# Patient Record
Sex: Female | Born: 1975 | Race: White | Hispanic: No | Marital: Single | Smoking: Never smoker
Health system: Southern US, Community
[De-identification: ages and names within clinical notes are randomized; demographics above are authoritative.]

## PROBLEM LIST (undated history)

## (undated) DIAGNOSIS — G43909 Migraine, unspecified, not intractable, without status migrainosus: Secondary | ICD-10-CM

## (undated) HISTORY — DX: Migraine, unspecified, not intractable, without status migrainosus: G43.909

## (undated) HISTORY — PX: COSMETIC SURGERY: SHX468

---

## 1999-07-16 ENCOUNTER — Inpatient Hospital Stay (HOSPITAL_COMMUNITY): Admission: AD | Admit: 1999-07-16 | Discharge: 1999-07-16 | Payer: Self-pay | Admitting: *Deleted

## 2002-05-02 ENCOUNTER — Other Ambulatory Visit: Admission: RE | Admit: 2002-05-02 | Discharge: 2002-05-02 | Payer: Self-pay | Admitting: *Deleted

## 2003-07-17 ENCOUNTER — Other Ambulatory Visit: Admission: RE | Admit: 2003-07-17 | Discharge: 2003-07-17 | Payer: Self-pay | Admitting: Obstetrics and Gynecology

## 2004-12-02 ENCOUNTER — Other Ambulatory Visit: Admission: RE | Admit: 2004-12-02 | Discharge: 2004-12-02 | Payer: Self-pay | Admitting: Obstetrics and Gynecology

## 2006-01-16 ENCOUNTER — Other Ambulatory Visit: Admission: RE | Admit: 2006-01-16 | Discharge: 2006-01-16 | Payer: Self-pay | Admitting: Obstetrics & Gynecology

## 2007-04-18 ENCOUNTER — Other Ambulatory Visit: Admission: RE | Admit: 2007-04-18 | Discharge: 2007-04-18 | Payer: Self-pay | Admitting: Obstetrics and Gynecology

## 2007-12-20 ENCOUNTER — Ambulatory Visit (HOSPITAL_COMMUNITY): Admission: RE | Admit: 2007-12-20 | Discharge: 2007-12-20 | Payer: Self-pay | Admitting: Obstetrics and Gynecology

## 2009-06-19 ENCOUNTER — Ambulatory Visit (HOSPITAL_COMMUNITY): Admission: RE | Admit: 2009-06-19 | Discharge: 2009-06-19 | Payer: Self-pay | Admitting: Obstetrics and Gynecology

## 2009-07-07 ENCOUNTER — Ambulatory Visit (HOSPITAL_COMMUNITY): Admission: RE | Admit: 2009-07-07 | Discharge: 2009-07-07 | Payer: Self-pay | Admitting: Obstetrics and Gynecology

## 2009-07-17 ENCOUNTER — Ambulatory Visit (HOSPITAL_COMMUNITY): Admission: RE | Admit: 2009-07-17 | Discharge: 2009-07-17 | Payer: Self-pay | Admitting: Obstetrics and Gynecology

## 2009-08-14 ENCOUNTER — Ambulatory Visit (HOSPITAL_COMMUNITY): Admission: RE | Admit: 2009-08-14 | Discharge: 2009-08-14 | Payer: Self-pay | Admitting: Internal Medicine

## 2009-09-25 ENCOUNTER — Ambulatory Visit (HOSPITAL_COMMUNITY): Admission: RE | Admit: 2009-09-25 | Discharge: 2009-09-25 | Payer: Self-pay | Admitting: Obstetrics and Gynecology

## 2009-10-05 ENCOUNTER — Inpatient Hospital Stay (HOSPITAL_COMMUNITY): Admission: AD | Admit: 2009-10-05 | Discharge: 2009-10-05 | Payer: Self-pay | Admitting: Obstetrics and Gynecology

## 2009-10-06 ENCOUNTER — Ambulatory Visit: Admission: RE | Admit: 2009-10-06 | Discharge: 2009-10-06 | Payer: Self-pay | Admitting: Obstetrics and Gynecology

## 2009-10-06 ENCOUNTER — Encounter (INDEPENDENT_AMBULATORY_CARE_PROVIDER_SITE_OTHER): Payer: Self-pay | Admitting: Obstetrics and Gynecology

## 2009-12-16 IMAGING — US US OB DETAIL+14 WK
1 series · 18 of 28 positions shown · non-contrast
Comparison: none

OBSTETRICAL ULTRASOUND:
 This ultrasound was performed in The [HOSPITAL], and the [facility] report will be stored to [REDACTED] PACS.

[Series 1: us ob detail+14 wk · 102 acquisitions, 18 frames shown]
[im 1/102]
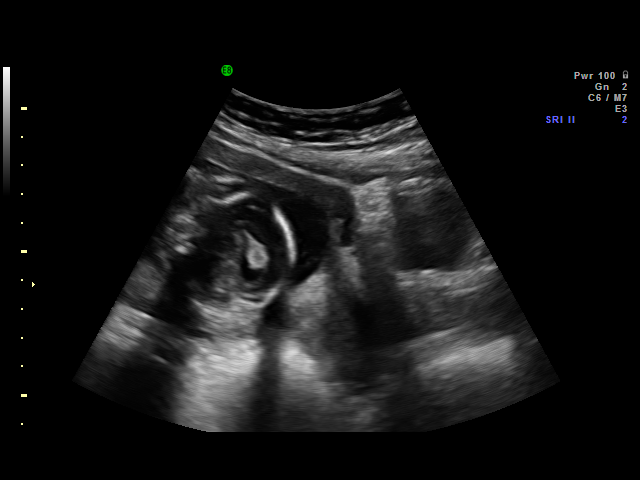
[im 8/102]
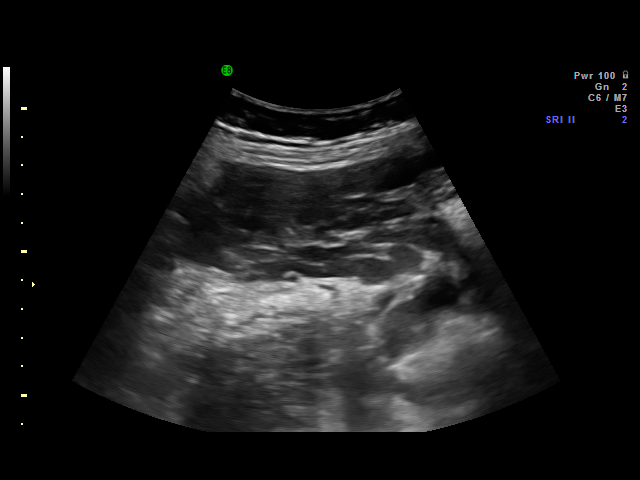
[im 12/102]
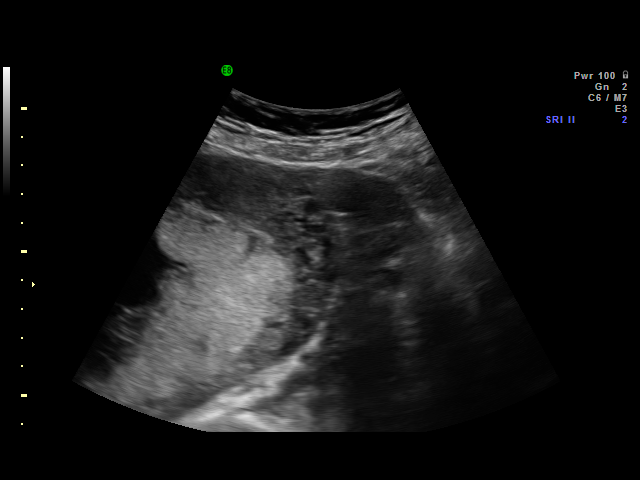
[im 19/102]
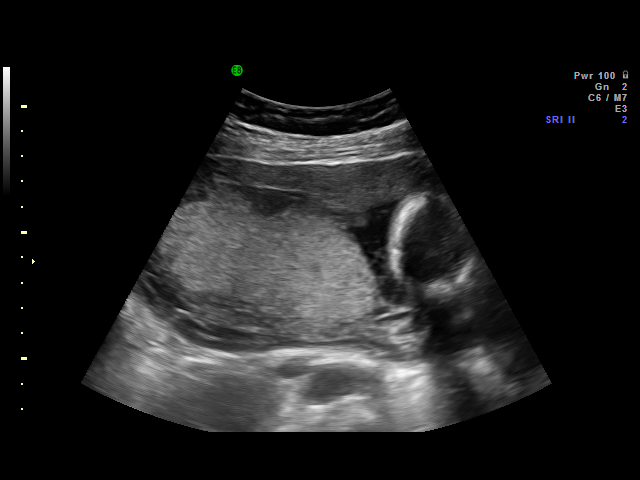
[im 27/102]
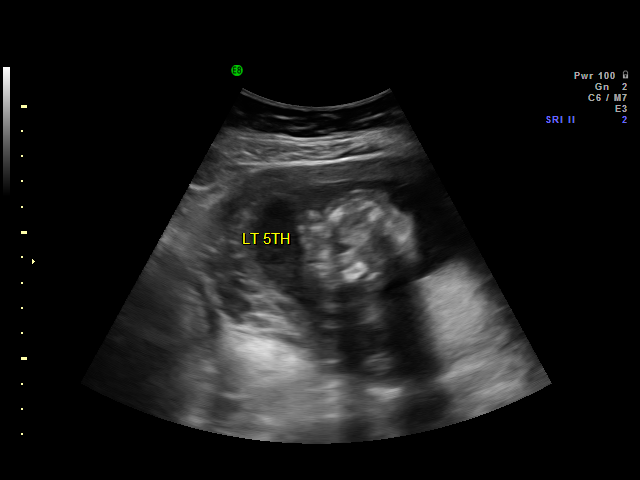
[im 30/102]
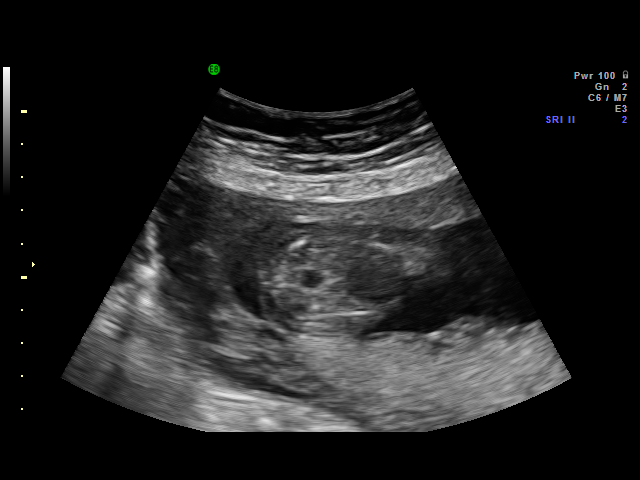
[im 38/102]
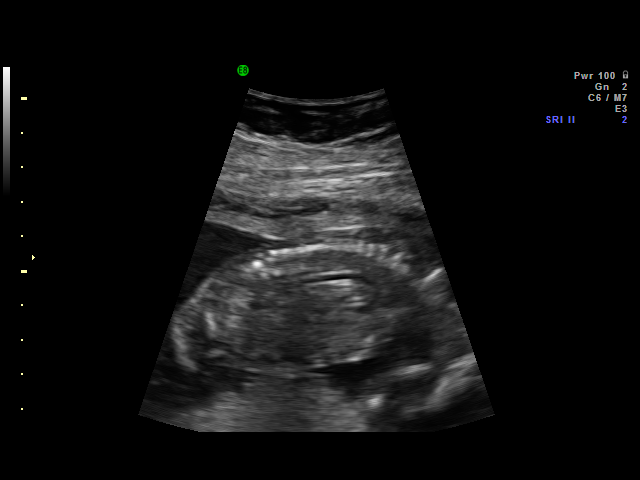
[im 42/102]
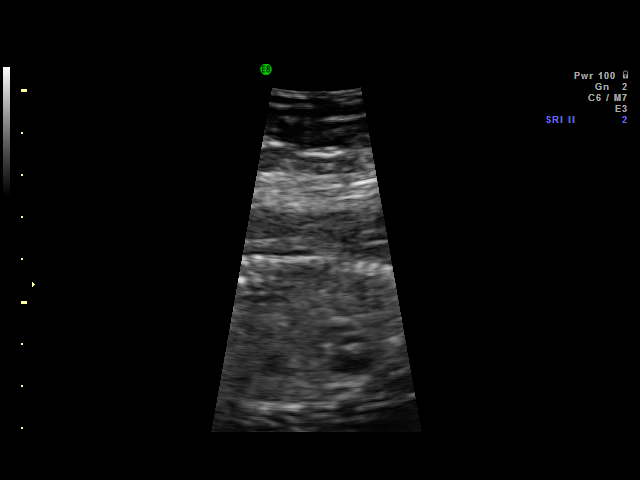
[im 49/102]
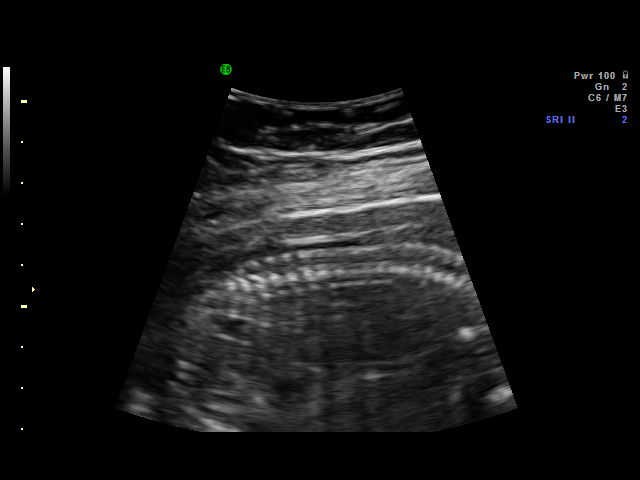
[im 53/102]
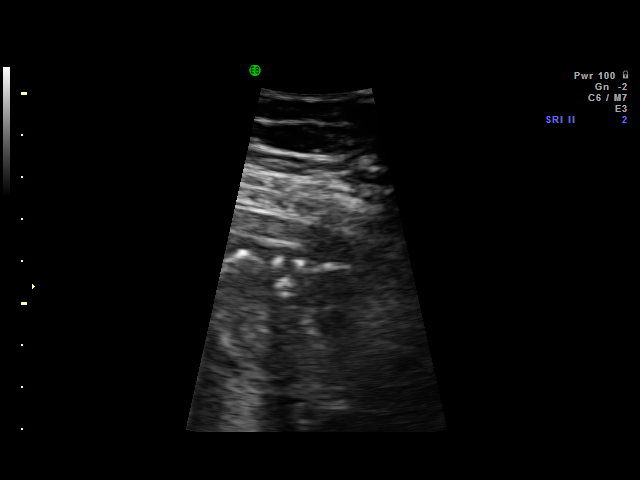
[im 60/102]
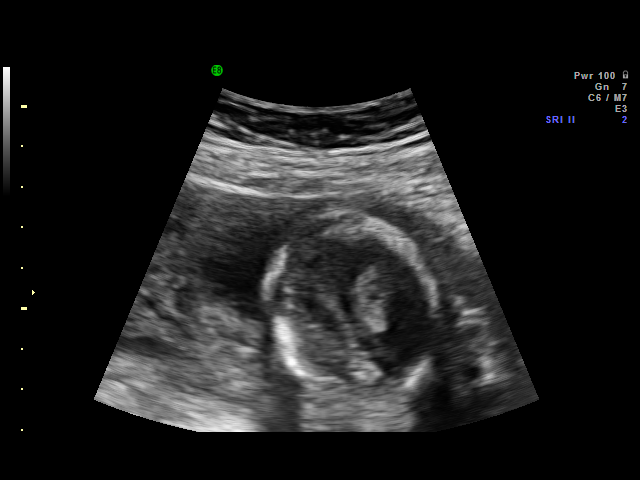
[im 64/102]
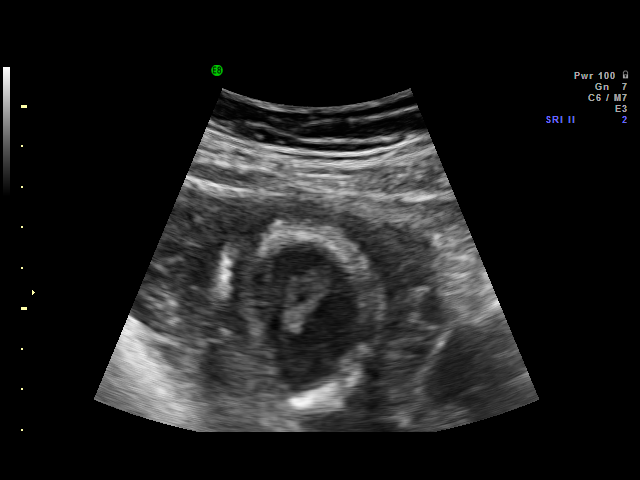
[im 72/102]
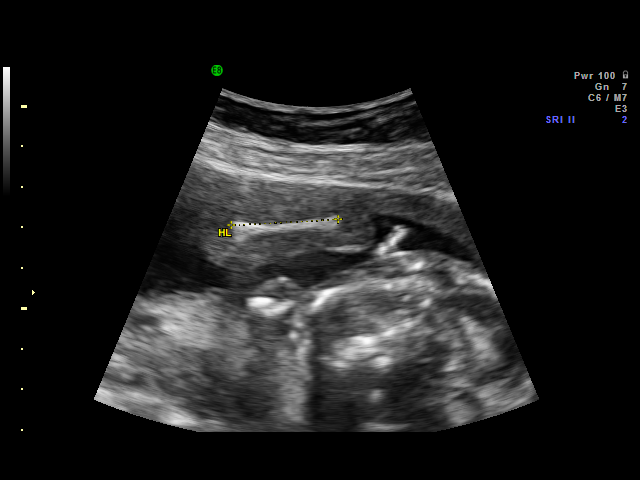
[im 79/102]
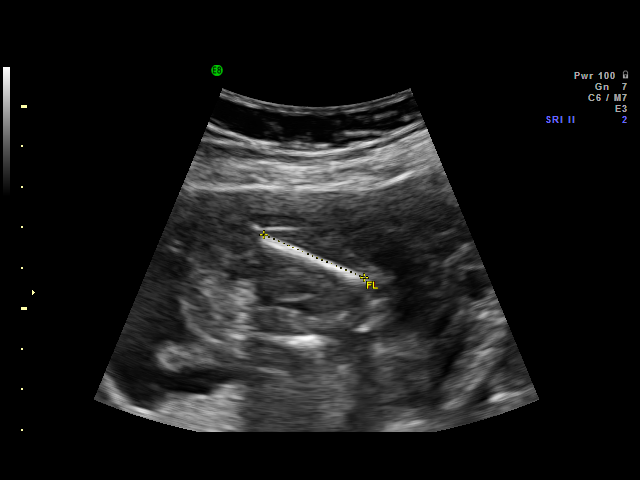
[im 83/102]
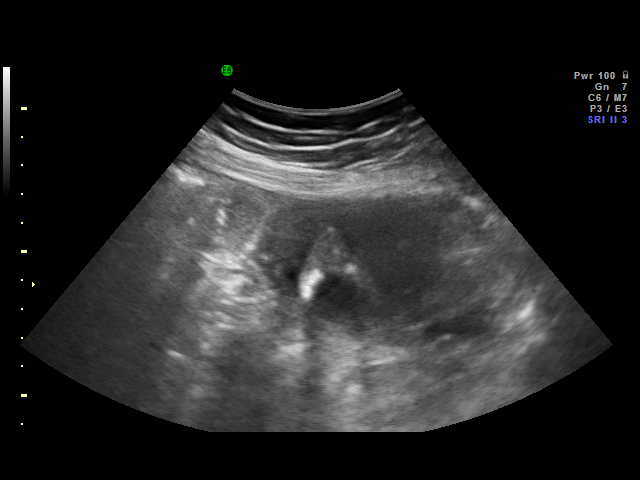
[im 90/102]
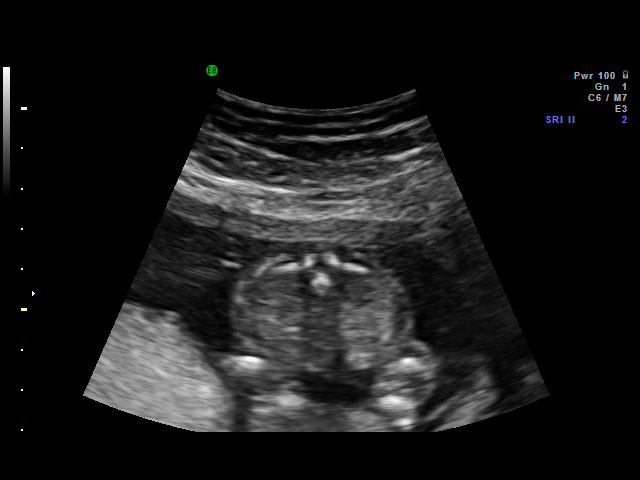
[im 94/102]
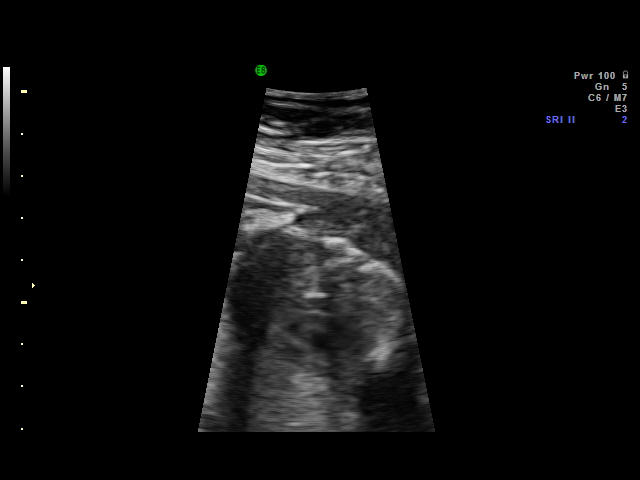
[im 102/102]
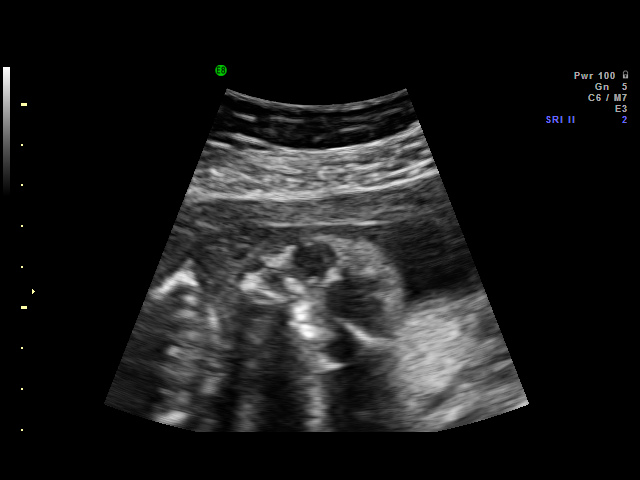

[18 of 28 positions shown; findings below may reference images not displayed]

IMPRESSION: [facility] has also been faxed to the ordering physician.

## 2009-12-18 ENCOUNTER — Inpatient Hospital Stay (HOSPITAL_COMMUNITY): Admission: AD | Admit: 2009-12-18 | Discharge: 2009-12-22 | Payer: Self-pay | Admitting: Obstetrics and Gynecology

## 2009-12-19 ENCOUNTER — Encounter (INDEPENDENT_AMBULATORY_CARE_PROVIDER_SITE_OTHER): Payer: Self-pay | Admitting: Obstetrics and Gynecology

## 2010-02-24 IMAGING — US US OB FOLLOW-UP
1 series · 14 of 28 positions shown · non-contrast
Comparison: none

OBSTETRICAL ULTRASOUND:
 This ultrasound was performed in The [HOSPITAL], and the [facility] report will be stored to [REDACTED] PACS.  This report is also available in [HOSPITAL]?s accessANYware.

[Series 1: us ob follow-up · 14 of 42 slices shown]
[im 2/42]
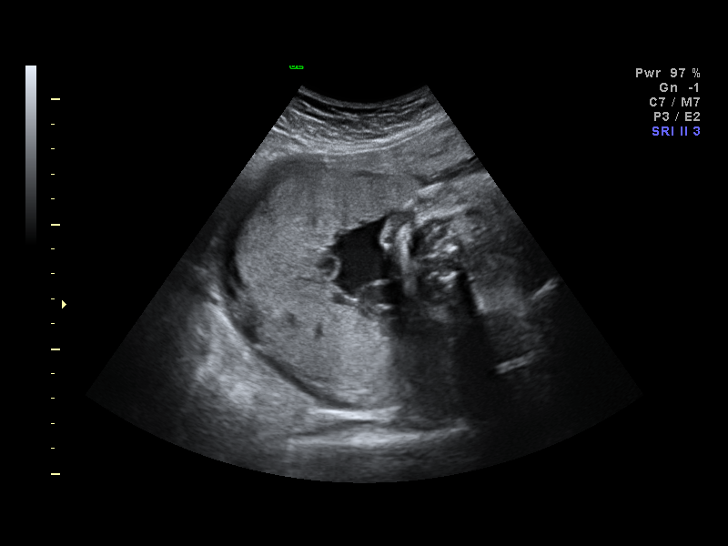
[im 5/42]
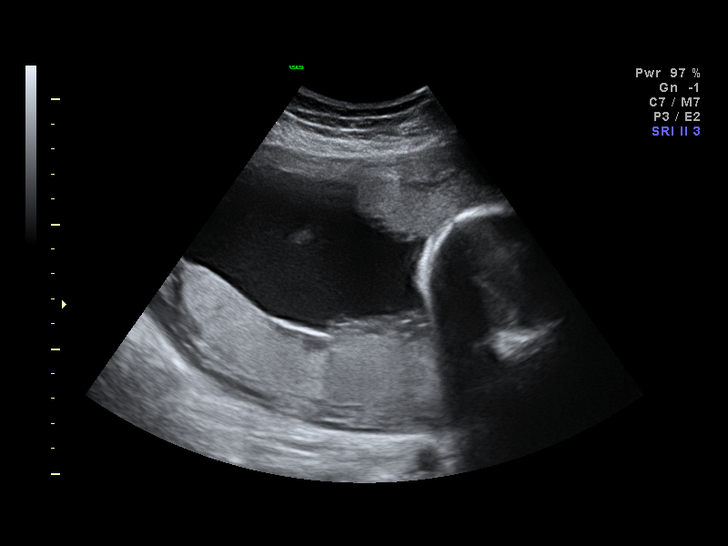
[im 8/42]
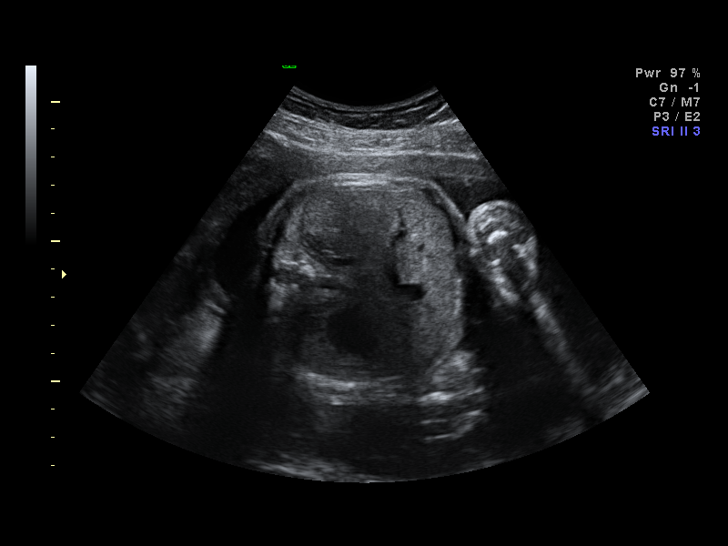
[im 11/42]
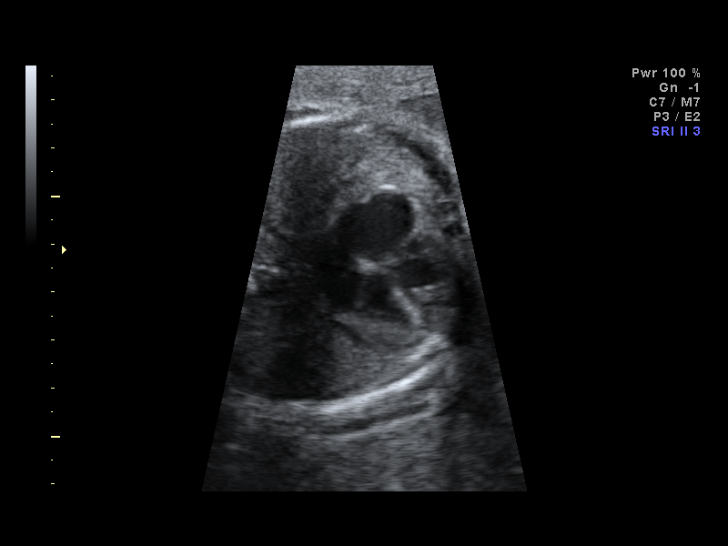
[im 14/42]
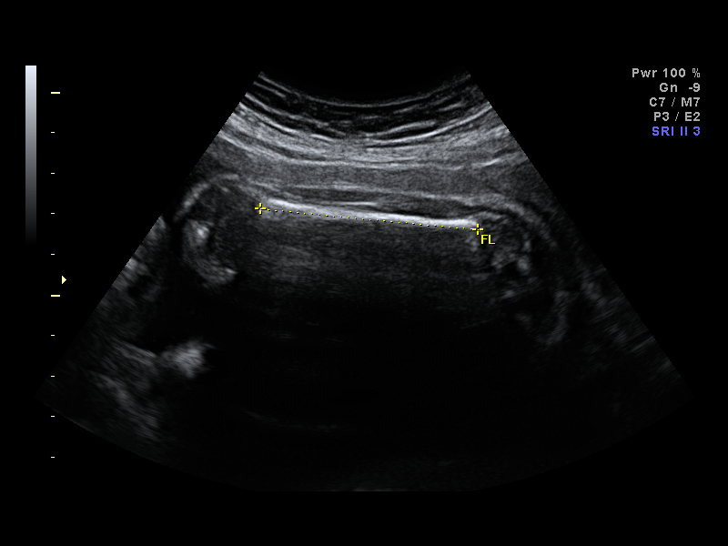
[im 17/42]
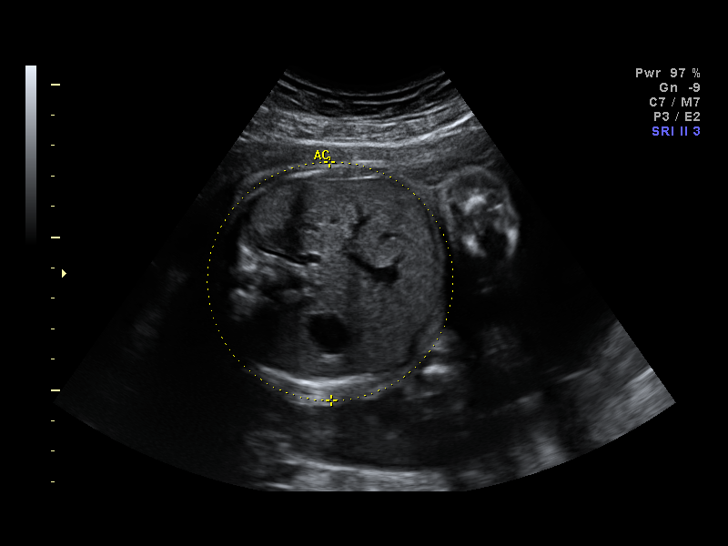
[im 20/42]
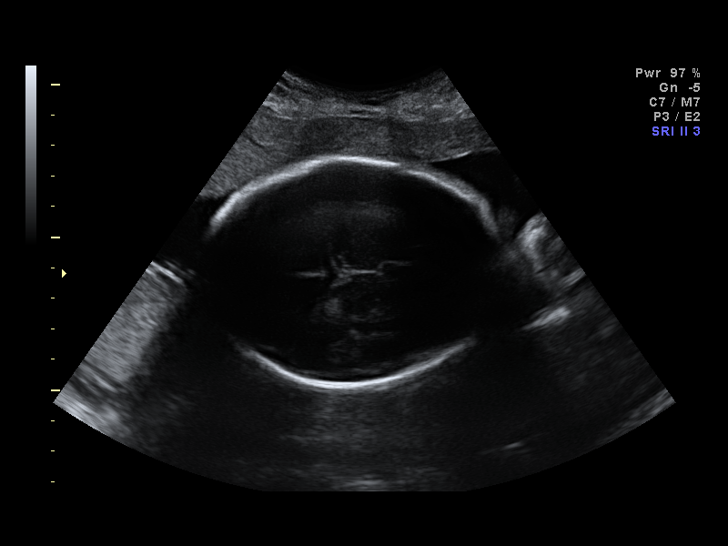
[im 23/42]
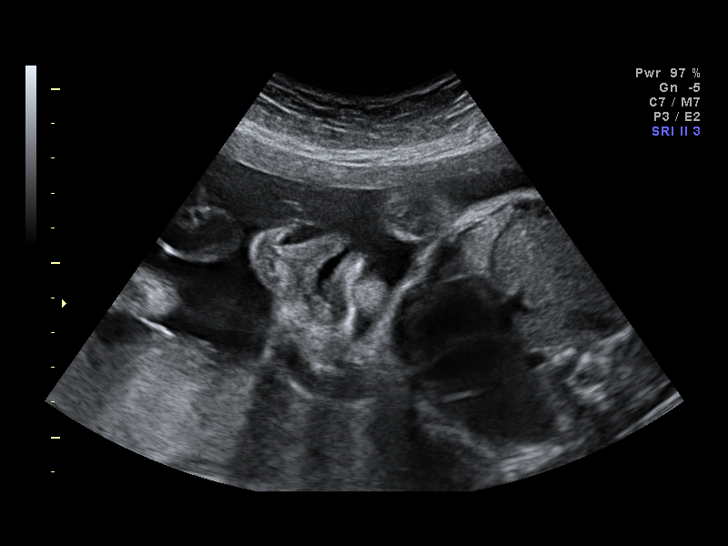
[im 26/42]
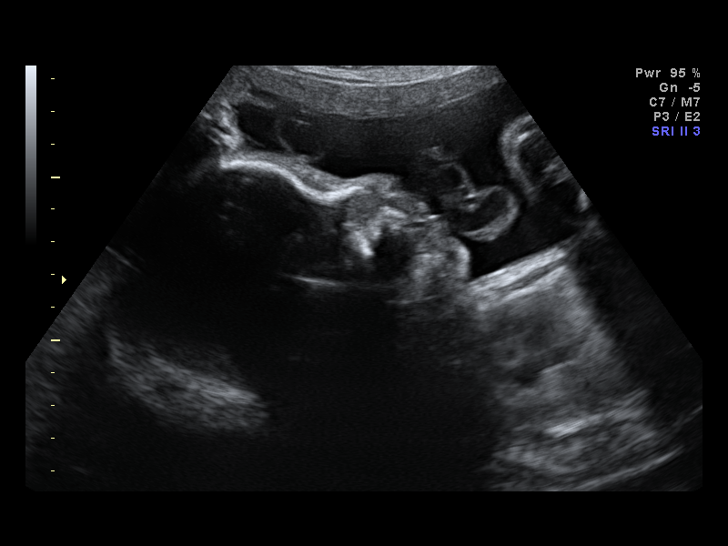
[im 29/42]
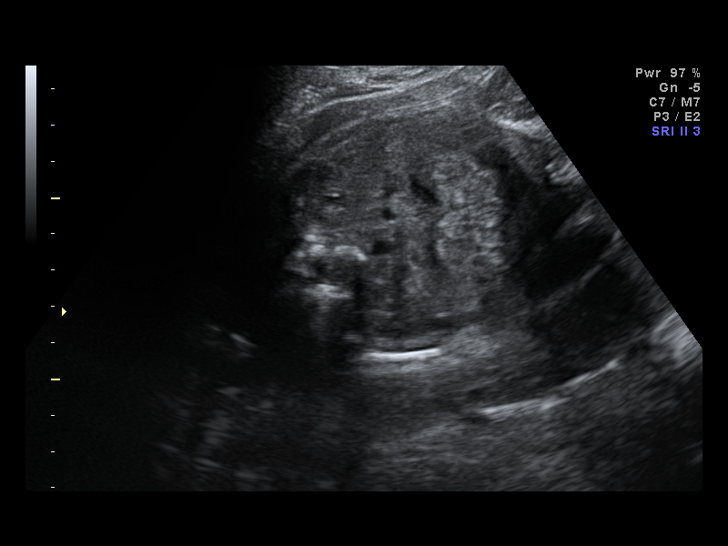
[im 32/42]
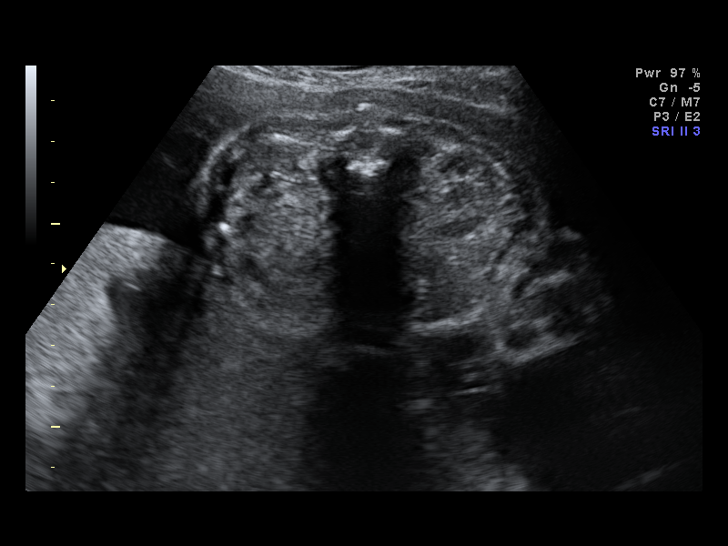
[im 35/42]
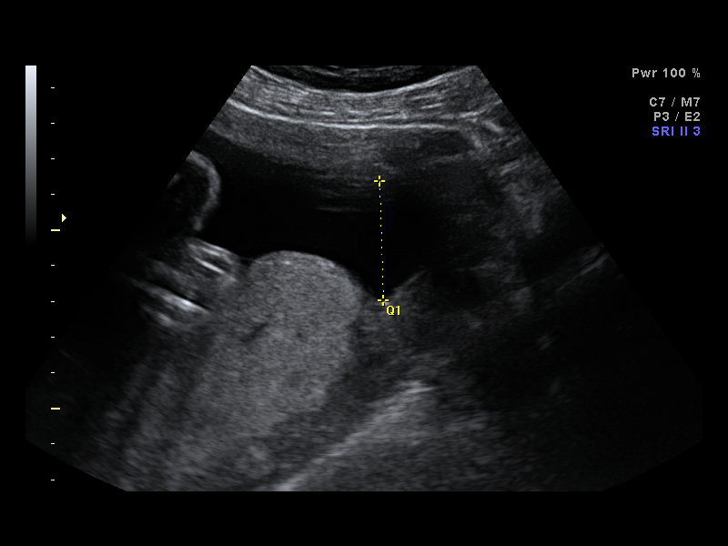
[im 38/42]
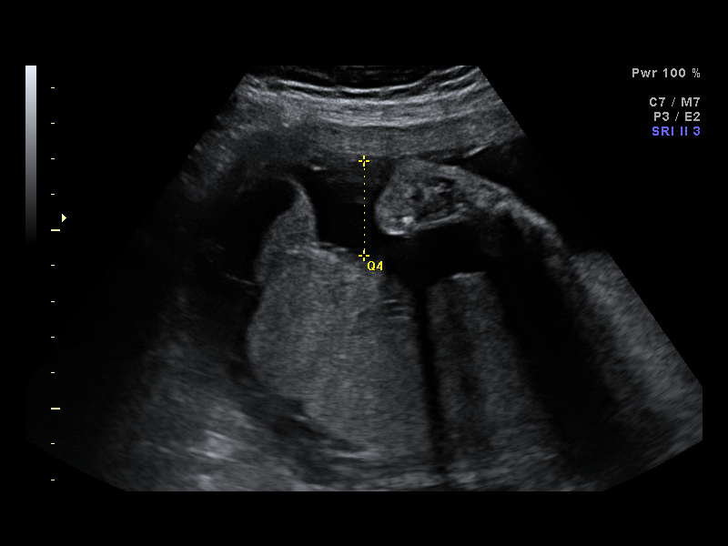
[im 42/42]
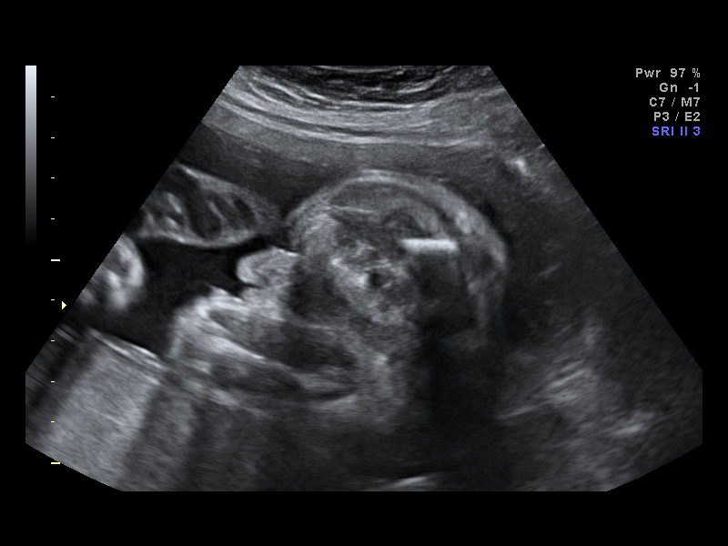

[14 of 28 positions shown; findings below may reference images not displayed]

IMPRESSION: [facility] has also been faxed to the ordering physician.

## 2011-02-02 LAB — CBC
HCT: 36.7 % (ref 36.0–46.0)
Hemoglobin: 11.2 g/dL — ABNORMAL LOW (ref 12.0–15.0)
Hemoglobin: 12.9 g/dL (ref 12.0–15.0)
MCHC: 33.1 g/dL (ref 30.0–36.0)
MCHC: 35.3 g/dL (ref 30.0–36.0)
Platelets: 221 10*3/uL (ref 150–400)
RDW: 13.5 % (ref 11.5–15.5)
WBC: 20.7 10*3/uL — ABNORMAL HIGH (ref 4.0–10.5)

## 2011-02-02 LAB — RPR: RPR Ser Ql: NONREACTIVE

## 2011-02-16 LAB — COMPREHENSIVE METABOLIC PANEL
BUN: 3 mg/dL — ABNORMAL LOW (ref 6–23)
CO2: 23 mEq/L (ref 19–32)
Calcium: 8.9 mg/dL (ref 8.4–10.5)
Chloride: 105 mEq/L (ref 96–112)
Creatinine, Ser: 0.44 mg/dL (ref 0.4–1.2)
GFR calc Af Amer: >60 mL/min (ref >60)
Glucose, Bld: 97 mg/dL (ref 70–99)
Total Bilirubin: 0.3 mg/dL (ref 0.3–1.2)

## 2011-02-16 LAB — CBC
MCHC: 33.8 g/dL (ref 30.0–36.0)
MCV: 93.7 fL (ref 78.0–100.0)
Platelets: 186 10*3/uL (ref 150–400)
RBC: 3.33 MIL/uL — ABNORMAL LOW (ref 3.87–5.11)

## 2011-02-16 LAB — URINALYSIS, ROUTINE W REFLEX MICROSCOPIC
Hgb urine dipstick: NEGATIVE
Ketones, ur: NEGATIVE mg/dL
Nitrite: NEGATIVE

## 2011-02-16 LAB — URINE CULTURE

## 2011-02-16 LAB — URINE MICROSCOPIC-ADD ON

## 2012-12-13 ENCOUNTER — Ambulatory Visit (INDEPENDENT_AMBULATORY_CARE_PROVIDER_SITE_OTHER): Payer: BC Managed Care – PPO | Admitting: Emergency Medicine

## 2012-12-13 VITALS — BP 130/86 | HR 91 | Temp 98.4°F | Resp 16 | Ht 67.0 in | Wt 170.0 lb

## 2012-12-13 DIAGNOSIS — G43909 Migraine, unspecified, not intractable, without status migrainosus: Secondary | ICD-10-CM

## 2012-12-13 DIAGNOSIS — G43109 Migraine with aura, not intractable, without status migrainosus: Secondary | ICD-10-CM

## 2012-12-13 DIAGNOSIS — J018 Other acute sinusitis: Secondary | ICD-10-CM

## 2012-12-13 DIAGNOSIS — J111 Influenza due to unidentified influenza virus with other respiratory manifestations: Secondary | ICD-10-CM

## 2012-12-13 MED ORDER — AMOXICILLIN-POT CLAVULANATE 875-125 MG PO TABS: 1.0000 | ORAL_TABLET | Freq: Two times a day (BID) | ORAL | Status: DC

## 2012-12-13 MED ORDER — RIZATRIPTAN BENZOATE 10 MG PO TBDP: 10.0000 mg | ORAL_TABLET | ORAL | Status: DC | PRN

## 2012-12-13 MED ORDER — PSEUDOEPHEDRINE-GUAIFENESIN ER 60-600 MG PO TB12: 1.0000 | ORAL_TABLET | Freq: Two times a day (BID) | ORAL | Status: AC

## 2012-12-13 NOTE — Patient Instructions (Addendum)
Migraine Headache A migraine headache is an intense, throbbing pain on one or both sides of your head. A migraine can last for 30 minutes to several hours. CAUSES  The exact cause of a migraine headache is not always known. However, a migraine may be caused when nerves in the brain become irritated and release chemicals that cause inflammation. This causes pain. SYMPTOMS  Pain on one or both sides of your head.  Pulsating or throbbing pain.  Severe pain that prevents daily activities.  Pain that is aggravated by any physical activity.  Nausea, vomiting, or both.  Dizziness.  Pain with exposure to bright lights, loud noises, or activity.  General sensitivity to bright lights, loud noises, or smells. Before you get a migraine, you may get warning signs that a migraine is coming (aura). An aura may include:  Seeing flashing lights.  Seeing bright spots, halos, or zig-zag lines.  Having tunnel vision or blurred vision.  Having feelings of numbness or tingling.  Having trouble talking.  Having muscle weakness. MIGRAINE TRIGGERS  Alcohol.  Smoking.  Stress.  Menstruation.  Aged cheeses.  Foods or drinks that contain nitrates, glutamate, aspartame, or tyramine.  Lack of sleep.  Chocolate.  Caffeine.  Hunger.  Physical exertion.  Fatigue.  Medicines used to treat chest pain (nitroglycerine), birth control pills, estrogen, and some blood pressure medicines. DIAGNOSIS  A migraine headache is often diagnosed based on:  Symptoms.  Physical examination.  A CT scan or MRI of your head. TREATMENT Medicines may be given for pain and nausea. Medicines can also be given to help prevent recurrent migraines.  HOME CARE INSTRUCTIONS  Only take over-the-counter or prescription medicines for pain or discomfort as directed by your caregiver. The use of long-term narcotics is not recommended.  Lie down in a dark, quiet room when you have a migraine.  Keep a journal  to find out what may trigger your migraine headaches. For example, write down:  What you eat and drink.  How much sleep you get.  Any change to your diet or medicines.  Limit alcohol consumption.  Quit smoking if you smoke.  Get 7 to 9 hours of sleep, or as recommended by your caregiver.  Limit stress.  Keep lights dim if bright lights bother you and make your migraines worse. SEEK IMMEDIATE MEDICAL CARE IF:   Your migraine becomes severe.  You have a fever.  You have a stiff neck.  You have vision loss.  You have muscular weakness or loss of muscle control.  You start losing your balance or have trouble walking.  You feel faint or pass out.  You have severe symptoms that are different from your first symptoms. MAKE SURE YOU:   Understand these instructions.  Will watch your condition.  Will get help right away if you are not doing well or get worse. Document Released: 10/31/2005 Document Revised: 01/23/2012 Document Reviewed: 10/21/2011 Exit[employer] Patient Information 2013 [employer].  

## 2012-12-13 NOTE — Progress Notes (Signed)
[facility] [address], [place] [address] [phone]  Date:  12/13/2012   Name:  Tracy Lambert   DOB:  [birth year]   MRN:  [id number]  PCP:  No primary provider on file.    Chief Complaint: Cough, Chills, Fatigue, Sinusitis and Cough   History of Present Illness:  Tracy Lambert is a 37 y.o. very pleasant female patient who presents with the following:  Ill since Saturday with cough that is largely non productive.  No wheezing or shortness of breath.  Has a fever and chills.  Now has purulent nasal drainage and post nasal drip.  Today developed a migraine associated with nausea and occasional vomiting.  No further fever.  No neuro or visual symptoms associated with acute headache.  Has never tried a triptan and relies on aspirin for headache relief.  There is no problem list on file for this patient.   History reviewed. No pertinent past medical history.  Past Surgical History  Procedure Date  . Cesarean section   . Cosmetic surgery     nose    History  Substance Use Topics  . Smoking status: Never Smoker   . Smokeless tobacco: Never Used  . Alcohol Use: 1.0 oz/week    2 drink(s) per week    Family History  Problem Relation Age of Onset  . Arthritis Mother     No Known Allergies  Medication list has been reviewed and updated.  No current outpatient prescriptions on file prior to visit.    Review of Systems:  As per HPI, otherwise negative.    Physical Examination: Filed Vitals:   12/13/12 1054  BP: 130/86  Pulse: 91  Temp: 98.4 F (36.9 C)  Resp: 16   Filed Vitals:   12/13/12 1054  Height: 5\' 7"  (1.702 m)  Weight: 170 lb (77.111 kg)   Body mass index is 26.63 kg/(m^2). Ideal Body Weight: Weight in (lb) to have BMI = 25: 159.3   GEN: WDWN, NAD, Non-toxic, A & O x 3 HEENT: Atraumatic, Normocephalic. Neck supple. No masses, No LAD. Ears and Nose: No external deformity. CV: RRR, No M/G/R. No JVD. No thrill. No extra heart  sounds. PULM: CTA B, no wheezes, crackles, rhonchi. No retractions. No resp. distress. No accessory muscle use. ABD: S, NT, ND, +BS. No rebound. No HSM. EXTR: No c/c/e NEURO Normal gait.  PSYCH: Normally interactive. Conversant. Not depressed or anxious appearing.  Calm demeanor.    Assessment and Plan: Sinusitis Migraine headache Influenza augmentin mucinex d maxaalt MLT  Carmelina Dane, MD

## 2014-10-29 ENCOUNTER — Ambulatory Visit: Payer: Self-pay | Admitting: Internal Medicine

## 2015-07-02 ENCOUNTER — Other Ambulatory Visit: Payer: Self-pay | Admitting: Family Medicine

## 2015-07-02 DIAGNOSIS — N6310 Unspecified lump in the right breast, unspecified quadrant: Secondary | ICD-10-CM

## 2015-07-08 ENCOUNTER — Other Ambulatory Visit: Payer: Self-pay

## 2015-07-14 ENCOUNTER — Other Ambulatory Visit: Payer: Self-pay

## 2016-02-18 ENCOUNTER — Emergency Department (HOSPITAL_COMMUNITY)
Admission: EM | Admit: 2016-02-18 | Discharge: 2016-02-19 | Disposition: A | Discharge status: Home / Self Care | Payer: BLUE CROSS/BLUE SHIELD | Attending: Emergency Medicine | Admitting: Emergency Medicine

## 2016-02-18 ENCOUNTER — Encounter (HOSPITAL_COMMUNITY): Payer: Self-pay

## 2016-02-18 DIAGNOSIS — Z792 Long term (current) use of antibiotics: Secondary | ICD-10-CM | POA: Insufficient documentation

## 2016-02-18 DIAGNOSIS — Y998 Other external cause status: Secondary | ICD-10-CM | POA: Insufficient documentation

## 2016-02-18 DIAGNOSIS — F10120 Alcohol abuse with intoxication, uncomplicated: Secondary | ICD-10-CM | POA: Insufficient documentation

## 2016-02-18 DIAGNOSIS — Y9289 Other specified places as the place of occurrence of the external cause: Secondary | ICD-10-CM | POA: Diagnosis not present

## 2016-02-18 DIAGNOSIS — S7010XA Contusion of unspecified thigh, initial encounter: Secondary | ICD-10-CM | POA: Diagnosis not present

## 2016-02-18 DIAGNOSIS — Y9389 Activity, other specified: Secondary | ICD-10-CM | POA: Insufficient documentation

## 2016-02-18 DIAGNOSIS — F439 Reaction to severe stress, unspecified: Secondary | ICD-10-CM | POA: Diagnosis not present

## 2016-02-18 DIAGNOSIS — F10920 Alcohol use, unspecified with intoxication, uncomplicated: Secondary | ICD-10-CM

## 2016-02-18 DIAGNOSIS — Z79899 Other long term (current) drug therapy: Secondary | ICD-10-CM | POA: Diagnosis not present

## 2016-02-18 DIAGNOSIS — F1310 Sedative, hypnotic or anxiolytic abuse, uncomplicated: Secondary | ICD-10-CM | POA: Insufficient documentation

## 2016-02-18 DIAGNOSIS — T424X1A Poisoning by benzodiazepines, accidental (unintentional), initial encounter: Secondary | ICD-10-CM | POA: Diagnosis present

## 2016-02-18 DIAGNOSIS — F430 Acute stress reaction: Secondary | ICD-10-CM

## 2016-02-18 LAB — ETHANOL: ALCOHOL ETHYL (B): 352 mg/dL — AB (ref <5)

## 2016-02-18 LAB — CBC
HEMATOCRIT: 42.2 % (ref 36.0–46.0)
Hemoglobin: 14.5 g/dL (ref 12.0–15.0)
MCH: 30.9 pg (ref 26.0–34.0)
MCHC: 34.4 g/dL (ref 30.0–36.0)
MCV: 89.8 fL (ref 78.0–100.0)
PLATELETS: 346 10*3/uL (ref 150–400)
RBC: 4.7 MIL/uL (ref 3.87–5.11)
RDW: 13 % (ref 11.5–15.5)
WBC: 7.6 10*3/uL (ref 4.0–10.5)

## 2016-02-18 LAB — ACETAMINOPHEN LEVEL: Acetaminophen (Tylenol), Serum: <10 ug/mL — ABNORMAL LOW (ref 10–30)

## 2016-02-18 LAB — COMPREHENSIVE METABOLIC PANEL
ALBUMIN: 4.8 g/dL (ref 3.5–5.0)
ALK PHOS: 53 U/L (ref 38–126)
ALT: 30 U/L (ref 14–54)
AST: 27 U/L (ref 15–41)
Anion gap: 13 (ref 5–15)
BILIRUBIN TOTAL: 0.3 mg/dL (ref 0.3–1.2)
BUN: 10 mg/dL (ref 6–20)
CALCIUM: 9 mg/dL (ref 8.9–10.3)
CO2: 25 mmol/L (ref 22–32)
CREATININE: 0.74 mg/dL (ref 0.44–1.00)
Chloride: 106 mmol/L (ref 101–111)
GFR calc Af Amer: >60 mL/min (ref >60)
GLUCOSE: 108 mg/dL — AB (ref 65–99)
POTASSIUM: 3.8 mmol/L (ref 3.5–5.1)
Sodium: 144 mmol/L (ref 135–145)
TOTAL PROTEIN: 8 g/dL (ref 6.5–8.1)

## 2016-02-18 LAB — RAPID URINE DRUG SCREEN, HOSP PERFORMED
Amphetamines: NOT DETECTED
BARBITURATES: NOT DETECTED
Benzodiazepines: POSITIVE — AB
Cocaine: NOT DETECTED
Opiates: NOT DETECTED
Tetrahydrocannabinol: NOT DETECTED

## 2016-02-18 LAB — CBG MONITORING, ED: GLUCOSE-CAPILLARY: 110 mg/dL — AB (ref 65–99)

## 2016-02-18 LAB — SALICYLATE LEVEL: Salicylate Lvl: <4 mg/dL (ref 2.8–30.0)

## 2016-02-18 MED ORDER — ACETAMINOPHEN 325 MG PO TABS
650.0000 mg | ORAL_TABLET | Freq: Once | ORAL | Status: AC
  Administered 2016-02-18: 650 mg via ORAL
  Filled 2016-02-18: qty 2

## 2016-02-18 MED ORDER — SODIUM CHLORIDE 0.9 % IV BOLUS (SEPSIS)
1000.0000 mL | Freq: Once | INTRAVENOUS | Status: AC
  Administered 2016-02-18: 1000 mL via INTRAVENOUS

## 2016-02-18 NOTE — ED Notes (Signed)
Per EMS pt very upset, states took unknown amount of xanax 0.5mg , beer and wine. Pt states going through a divorce and upset over custody of kids,
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
## 2016-02-18 NOTE — ED Notes (Signed)
Bed: RESB Expected date:  Expected time:  Means of arrival:  Comments: EMD 40 yo female intoxicated/overdosed xanax

## 2016-02-19 MED ORDER — SODIUM CHLORIDE 0.9 % IV BOLUS (SEPSIS)
1000.0000 mL | Freq: Once | INTRAVENOUS | Status: AC
  Administered 2016-02-19: 1000 mL via INTRAVENOUS

## 2016-02-19 MED ORDER — IBUPROFEN 200 MG PO TABS
600.0000 mg | ORAL_TABLET | Freq: Once | ORAL | Status: AC
  Administered 2016-02-19: 600 mg via ORAL
  Filled 2016-02-19: qty 3

## 2016-02-19 NOTE — Discharge Instructions (Signed)
Alcohol Intoxication  Alcohol intoxication occurs when the amount of alcohol that a person has consumed impairs his or her ability to mentally and physically function. Alcohol directly impairs the normal chemical activity of the brain. Drinking large amounts of alcohol can lead to changes in mental function and behavior, and it can cause many physical effects that can be harmful.   Alcohol intoxication can range in severity from mild to very severe. Various factors can affect the level of intoxication that occurs, such as the person's age, gender, weight, frequency of alcohol consumption, and the presence of other medical conditions (such as diabetes, seizures, or heart conditions). Dangerous levels of alcohol intoxication may occur when people drink large amounts of alcohol in a short period (binge drinking). Alcohol can also be especially dangerous when combined with certain prescription medicines or "recreational" drugs.  SIGNS AND SYMPTOMS  Some common signs and symptoms of mild alcohol intoxication include:  · Loss of coordination.  · Changes in mood and behavior.  · Impaired judgment.  · Slurred speech.  As alcohol intoxication progresses to more severe levels, other signs and symptoms will appear. These may include:  · Vomiting.  · Confusion and impaired memory.  · Slowed breathing.  · Seizures.  · Loss of consciousness.  DIAGNOSIS   Your health care provider will take a medical history and perform a physical exam. You will be asked about the amount and type of alcohol you have consumed. Blood tests will be done to measure the concentration of alcohol in your blood. In many places, your blood alcohol level must be lower than 80 mg/dL (0.08%) to legally drive. However, many dangerous effects of alcohol can occur at much lower levels.   TREATMENT   People with alcohol intoxication often do not require treatment. Most of the effects of alcohol intoxication are temporary, and they go away as the alcohol naturally  leaves the body. Your health care provider will monitor your condition until you are stable enough to go home. Fluids are sometimes given through an IV access tube to help prevent dehydration.   HOME CARE INSTRUCTIONS  · Do not drive after drinking alcohol.  · Stay hydrated. Drink enough water and fluids to keep your urine clear or pale yellow. Avoid caffeine.    · Only take over-the-counter or prescription medicines as directed by your health care provider.    SEEK MEDICAL CARE IF:   · You have persistent vomiting.    · You do not feel better after a few days.  · You have frequent alcohol intoxication. Your health care provider can help determine if you should see a substance use treatment counselor.  SEEK IMMEDIATE MEDICAL CARE IF:   · You become shaky or tremble when you try to stop drinking.    · You shake uncontrollably (seizure).    · You throw up (vomit) blood. This may be bright red or may look like black coffee grounds.    · You have blood in your stool. This may be bright red or may appear as a black, tarry, bad smelling stool.    · You become lightheaded or faint.    MAKE SURE YOU:   · Understand these instructions.  · Will watch your condition.  · Will get help right away if you are not doing well or get worse.     This information is not intended to replace advice given to you by your health care provider. Make sure you discuss any questions you have with your health care provider.       Document Released: 08/10/2005 Document Revised: 07/03/2013 Document Reviewed: 04/05/2013  Elsevier Interactive Patient Education ©2016 [employer]vier [employer].

## 2016-02-19 NOTE — ED Provider Notes (Signed)
2:57 AM Awake and alert. Continues to deny suicidal ideation or intent. Mother will take her home.    Tracy LibraJohn Tracy Governale, MD 02/19/16 [phone]
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
## 2018-11-20 ENCOUNTER — Ambulatory Visit (INDEPENDENT_AMBULATORY_CARE_PROVIDER_SITE_OTHER): Payer: 59 | Admitting: Psychiatry

## 2018-11-20 DIAGNOSIS — F1010 Alcohol abuse, uncomplicated: Secondary | ICD-10-CM

## 2018-11-20 DIAGNOSIS — F39 Unspecified mood [affective] disorder: Secondary | ICD-10-CM

## 2018-11-20 DIAGNOSIS — F429 Obsessive-compulsive disorder, unspecified: Secondary | ICD-10-CM

## 2018-11-20 MED ORDER — ALPRAZOLAM 0.5 MG PO TABS: 0.5000 mg | ORAL_TABLET | Freq: Every evening | ORAL | 0 refills | Status: DC | PRN

## 2018-11-20 MED ORDER — DULOXETINE HCL 30 MG PO CPEP: 30.0000 mg | ORAL_CAPSULE | Freq: Every day | ORAL | 0 refills | Status: DC

## 2018-11-20 MED ORDER — QUETIAPINE FUMARATE 50 MG PO TABS: 50.0000 mg | ORAL_TABLET | Freq: Every day | ORAL | 0 refills | Status: DC

## 2018-11-20 MED ORDER — DULOXETINE HCL 60 MG PO CPEP: 60.0000 mg | ORAL_CAPSULE | Freq: Every day | ORAL | 0 refills | Status: DC
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
## 2018-12-07 ENCOUNTER — Ambulatory Visit: Payer: 59 | Admitting: Mental Health

## 2018-12-11 ENCOUNTER — Ambulatory Visit (INDEPENDENT_AMBULATORY_CARE_PROVIDER_SITE_OTHER): Payer: 59 | Admitting: Psychiatry

## 2018-12-11 DIAGNOSIS — F39 Unspecified mood [affective] disorder: Secondary | ICD-10-CM

## 2018-12-11 DIAGNOSIS — F411 Generalized anxiety disorder: Secondary | ICD-10-CM

## 2018-12-11 DIAGNOSIS — F1011 Alcohol abuse, in remission: Secondary | ICD-10-CM

## 2018-12-11 MED ORDER — QUETIAPINE FUMARATE 50 MG PO TABS: 50.0000 mg | ORAL_TABLET | Freq: Every day | ORAL | 2 refills | Status: DC

## 2018-12-11 MED ORDER — ALPRAZOLAM 0.5 MG PO TABS: 0.5000 mg | ORAL_TABLET | Freq: Every evening | ORAL | 3 refills | Status: DC | PRN

## 2018-12-11 MED ORDER — DULOXETINE HCL 60 MG PO CPEP: 60.0000 mg | ORAL_CAPSULE | Freq: Every day | ORAL | 2 refills | Status: DC

## 2018-12-11 NOTE — Progress Notes (Signed)
[facility]  Patient ID: Chanyah Isaak,  MRN: [id number]  PCP: Patient, No Pcp Per  Date of Evaluation: 12/11/2018 Time spent:20 minutes  Chief Complaint:   HISTORY/CURRENT STATUS: HPI patient seen last 3 weeks ago.  He was started on Cymbalta and is doing well.  Currently he continues to feel good.  She is having no depression.  Manic symptoms increased talking she is happy and more goal oriented She is having some anxiety She is not drinking alcohol or using drugs  Individual Medical History/ Review of Systems: Changes? :No   Allergies: Patient has no known allergies.  Current Medications:  Current Outpatient Medications:  .  ALPRAZolam (XANAX) 0.5 MG tablet, Take 1 tablet (0.5 mg total) by mouth at bedtime as needed for anxiety., Disp: 30 tablet, Rfl: 0 .  amoxicillin-clavulanate (AUGMENTIN) 875-125 MG per tablet, Take 1 tablet by mouth 2 (two) times daily., Disp: 20 tablet, Rfl: 0 .  DULoxetine (CYMBALTA) 30 MG capsule, Take 1 capsule (30 mg total) by mouth daily., Disp: 7 capsule, Rfl: 0 .  DULoxetine (CYMBALTA) 60 MG capsule, Take 1 capsule (60 mg total) by mouth daily., Disp: 30 capsule, Rfl: 0 .  QUEtiapine (SEROQUEL) 50 MG tablet, Take 1 tablet (50 mg total) by mouth at bedtime., Disp: 30 tablet, Rfl: 0 .  rizatriptan (MAXALT-MLT) 10 MG disintegrating tablet, Take 1 tablet (10 mg total) by mouth as needed for migraine. May repeat in 2 hours if needed, Disp: 10 tablet, Rfl: 5 Medication Side Effects: none  Family Medical/ Social History: Changes? No  MENTAL HEALTH EXAM:  There were no vitals taken for this visit.There is no height or weight on file to calculate BMI.  General Appearance: Casual  Eye Contact:  Good  Speech:  Normal Rate  Volume:  Normal  Mood:  Euthymic  Affect:  Appropriate  Thought Process:  Linear  Orientation:  Full (Time, Place, and Person)  Thought Content: Logical   Suicidal Thoughts:  No  Homicidal Thoughts:  No  Memory:  WNL   Judgement:  Good  Insight:  Good  Psychomotor Activity:  Normal  Concentration:  Concentration: Good  Recall:  Good  Fund of Knowledge: Good  Language: Good  Assets:  Desire for Improvement  ADL's:  Intact  Cognition: WNL  Prognosis:  Good    DIAGNOSES: No diagnosis found.  Receiving Psychotherapy: No    RECOMMENDATIONS: As mentioned patient is doing well.  She is to continue her Cymbalta 60 mg a day, Seroquel 50 mg at bedtime, Xanax 0.5 mg 4 times a day.  She is cautioned to if she has any manic symptoms or mood swings while he is on Cymbalta to let me know. She is to return in 6 weeks.  I offered a mood diary but she said she probably would not keep it.   Anne Fu, PA-C

## 2018-12-26 ENCOUNTER — Ambulatory Visit (INDEPENDENT_AMBULATORY_CARE_PROVIDER_SITE_OTHER): Payer: 59 | Admitting: Mental Health

## 2018-12-26 DIAGNOSIS — F39 Unspecified mood [affective] disorder: Secondary | ICD-10-CM
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
## 2019-01-13 ENCOUNTER — Other Ambulatory Visit: Payer: Self-pay | Admitting: Psychiatry

## 2019-01-16 ENCOUNTER — Ambulatory Visit: Payer: 59 | Admitting: Mental Health

## 2019-01-23 ENCOUNTER — Ambulatory Visit: Payer: 59 | Admitting: Psychiatry

## 2019-02-18 ENCOUNTER — Telehealth: Payer: Self-pay | Admitting: Psychiatry

## 2019-02-18 ENCOUNTER — Other Ambulatory Visit: Payer: Self-pay

## 2019-02-18 MED ORDER — QUETIAPINE FUMARATE 50 MG PO TABS: 50.0000 mg | ORAL_TABLET | Freq: Every day | ORAL | 2 refills | Status: DC

## 2019-02-18 NOTE — Telephone Encounter (Signed)
Refill submitted per request.

## 2019-02-18 NOTE — Telephone Encounter (Signed)
Patient called and said that she needs a refill of her seroquel 50 mg 1 tab at bedtime to be sent to [facility] at [address]

## 2019-02-23 ENCOUNTER — Other Ambulatory Visit: Payer: Self-pay | Admitting: Physician Assistant

## 2019-03-15 ENCOUNTER — Other Ambulatory Visit: Payer: Self-pay

## 2019-03-15 MED ORDER — ALPRAZOLAM 0.5 MG PO TABS: 0.5000 mg | ORAL_TABLET | Freq: Every evening | ORAL | 0 refills | Status: DC | PRN

## 2019-03-15 MED ORDER — DULOXETINE HCL 60 MG PO CPEP: 60.0000 mg | ORAL_CAPSULE | Freq: Every day | ORAL | 0 refills | Status: DC

## 2019-04-05 ENCOUNTER — Other Ambulatory Visit: Payer: Self-pay

## 2019-04-05 ENCOUNTER — Ambulatory Visit (INDEPENDENT_AMBULATORY_CARE_PROVIDER_SITE_OTHER): Payer: 59 | Admitting: Psychiatry

## 2019-04-05 ENCOUNTER — Encounter: Payer: Self-pay | Admitting: Psychiatry

## 2019-04-05 DIAGNOSIS — F39 Unspecified mood [affective] disorder: Secondary | ICD-10-CM

## 2019-04-05 DIAGNOSIS — G4700 Insomnia, unspecified: Secondary | ICD-10-CM

## 2019-04-05 DIAGNOSIS — F411 Generalized anxiety disorder: Secondary | ICD-10-CM

## 2019-04-05 MED ORDER — QUETIAPINE FUMARATE 50 MG PO TABS: 50.0000 mg | ORAL_TABLET | Freq: Every day | ORAL | 0 refills | Status: DC

## 2019-04-05 MED ORDER — DULOXETINE HCL 60 MG PO CPEP: 60.0000 mg | ORAL_CAPSULE | Freq: Every day | ORAL | 0 refills | Status: DC

## 2019-04-05 NOTE — Progress Notes (Signed)
Tracy ItoShana Kubicek [id number] 04-Jan-1976 43 y.o.  Virtual Visit via Telephone Note  I connected with@ on 04/05/19 at  3:15 PM EDT by telephone and verified that I am speaking with the correct person using two identifiers.   I discussed the limitations, risks, security and privacy concerns of performing an evaluation and management service by telephone and the availability of in person appointments. I also discussed with the patient that there may be a patient responsible charge related to this service. The patient expressed understanding and agreed to proceed.   I discussed the assessment and treatment plan with the patient. The patient was provided an opportunity to ask questions and all were answered. The patient agreed with the plan and demonstrated an understanding of the instructions.   The patient was advised to call back or seek an in-person evaluation if the symptoms worsen or if the condition fails to improve as anticipated.  I provided 30 minutes of non-face-to-face time during this encounter.  The patient was located at home.  The provider was located at home.   Corie ChiquitoJessica Ashla Murph, PMHNP   Subjective:   Patient ID:  Tracy Lambert is a 43 y.o. (DOB 04-Jan-1976) female.  Chief Complaint:  Chief Complaint  Patient presents with  . Follow-up    h/o anxiety, depression, and insomnia    HPI Tracy Lambert presents for follow-up of mood. She reports that her mood has been "ok." She reports that she has been dealing with some anxiety and depression with returning to work and trying to see her son. Reports that her visitation with her son has been altered with pandemic. Some worry and anxious thoughts. Reports that she occ experiences physical s/s with anxiety but denies panic attacks. She reports that Xanax is helpful for her anxiety. Feels that Xanax does not seem to be as effective as it once. She reports that she typically only has brief periods of depression, never lasting longer than 1 day.  Denies risky or impulsive behavior. Reports that she tends to isolate when depressed. She reports that she typically tries to get 8 hours of sleep. Reports that her appetite has been ok and has been gaining weight. She reports energy and motivation are adequate. Concentration is adequate as long as her sleep is adequate and she is abstaining from ETOH. Denies SI.  Reports that she is mostly abstaining from ETOH.   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   Past Psychiatric Medication Trials: Cymbalta Zoloft  Prozac- Not effective Seroquel- Reports that she felt over medicated on higher doses of Seroquel (possibly 200 mg) Xanax Phentermine- increased anxiety, tension   Review of Systems:  Review of Systems  Musculoskeletal: Negative for gait problem.  Neurological: Negative for tremors.  Psychiatric/Behavioral:       Please refer to HPI    Medications: I have reviewed the patient's current medications.  Current Outpatient Medications  Medication Sig Dispense Refill  . ALPRAZolam (XANAX) 0.5 MG tablet Take 1 tablet (0.5 mg total) by mouth at bedtime as needed for anxiety. 30 tablet 0  . DULoxetine (CYMBALTA) 60 MG capsule Take 1 capsule (60 mg total) by mouth daily. 90 capsule 0  . QUEtiapine (SEROQUEL) 50 MG tablet Take 1 tablet (50 mg total) by mouth at bedtime. 90 tablet 0   No current facility-administered medications for this visit.     Medication Side Effects: None  Allergies: No Known Allergies  History reviewed. No pertinent past medical history.  Family History  Problem Relation Age  of Onset  . Arthritis Mother     Social History   Socioeconomic History  . Marital status: Married    Spouse name: Not on file  . Number of children: Not on file  . Years of education: Not on file  . Highest education level: Not on file  Occupational History  . Not on file  Social Needs  . Financial resource strain: Not on file  . Food insecurity:    Worry: Not on  file    Inability: Not on file  . Transportation needs:    Medical: Not on file    Non-medical: Not on file  Tobacco Use  . Smoking status: Never Smoker  . Smokeless tobacco: Never Used  Substance and Sexual Activity  . Alcohol use: Yes    Alcohol/week: 2.0 standard drinks    Types: 2 drink(s) per week  . Drug use: No  . Sexual activity: Not on file  Lifestyle  . Physical activity:    Days per week: Not on file    Minutes per session: Not on file  . Stress: Not on file  Relationships  . Social connections:    Talks on phone: Not on file    Gets together: Not on file    Attends religious service: Not on file    Active member of club or organization: Not on file    Attends meetings of clubs or organizations: Not on file    Relationship status: Not on file  . Intimate partner violence:    Fear of current or ex partner: Not on file    Emotionally abused: Not on file    Physically abused: Not on file    Forced sexual activity: Not on file  Other Topics Concern  . Not on file  Social History Narrative  . Not on file    Past Medical History, Surgical history, Social history, and Family history were reviewed and updated as appropriate.   Please see review of systems for further details on the patient's review from today.   Objective:   Physical Exam:  There were no vitals taken for this visit.  Physical Exam Neurological:     Mental Status: She is alert and oriented to person, place, and time.     Cranial Nerves: No dysarthria.  Psychiatric:        Attention and Perception: Attention normal.        Mood and Affect: Mood is not depressed.        Speech: Speech normal.        Behavior: Behavior is cooperative.        Thought Content: Thought content normal. Thought content is not paranoid or delusional. Thought content does not include homicidal or suicidal ideation. Thought content does not include homicidal or suicidal plan.        Cognition and Memory: Cognition and  memory normal.        Judgment: Judgment normal.     Comments: Mood presents as mildly anxious.     Lab Review:     Component Value Date/Time   NA 144 02/18/2016 2200   K 3.8 02/18/2016 2200   CL 106 02/18/2016 2200   CO2 25 02/18/2016 2200   GLUCOSE 108 (H) 02/18/2016 2200   BUN 10 02/18/2016 2200   CREATININE 0.74 02/18/2016 2200   CALCIUM 9.0 02/18/2016 2200   PROT 8.0 02/18/2016 2200   ALBUMIN 4.8 02/18/2016 2200   AST 27 02/18/2016 2200   ALT 30 02/18/2016  2200   ALKPHOS 53 02/18/2016 2200   BILITOT 0.3 02/18/2016 2200   GFRNONAA >60 02/18/2016 2200   GFRAA >60 02/18/2016 2200       Component Value Date/Time   WBC 7.6 02/18/2016 2200   RBC 4.70 02/18/2016 2200   HGB 14.5 02/18/2016 2200   HCT 42.2 02/18/2016 2200   PLT 346 02/18/2016 2200   MCV 89.8 02/18/2016 2200   MCH 30.9 02/18/2016 2200   MCHC 34.4 02/18/2016 2200   RDW 13.0 02/18/2016 2200    No results found for: POCLITH, LITHIUM   No results found for: PHENYTOIN, PHENOBARB, VALPROATE, CBMZ   .res Assessment: Plan:   Continue Cymbalta 60 mg daily for depression and anxiety. Continue Seroquel 50 mg at bedtime for insomnia and mood. Continue alprazolam 0.5 mg daily as needed for anxiety.  Discussed not increasing at this time until complete record could be reviewed.  Patient to follow-up in 2 to 3 months or sooner if clinically indicated. Patient advised to contact office with any questions, adverse effects, or acute worsening in signs and symptoms.  Episodic mood disorder (HCC) - Plan: DULoxetine (CYMBALTA) 60 MG capsule, QUEtiapine (SEROQUEL) 50 MG tablet  Anxiety state - Plan: DULoxetine (CYMBALTA) 60 MG capsule  Insomnia, unspecified type - Plan: QUEtiapine (SEROQUEL) 50 MG tablet  Please see After Visit Summary for patient specific instructions.  Future Appointments  Date Time Provider Department Center  06/05/2019 10:00 AM Corie Chiquito, PMHNP [facility] None    No orders of the defined  types were placed in this encounter.     -------------------------------

## 2019-04-28 ENCOUNTER — Other Ambulatory Visit: Payer: Self-pay | Admitting: Psychiatry

## 2019-05-02 ENCOUNTER — Telehealth: Payer: Self-pay | Admitting: Psychiatry

## 2019-05-02 DIAGNOSIS — F39 Unspecified mood [affective] disorder: Secondary | ICD-10-CM

## 2019-05-02 DIAGNOSIS — G4700 Insomnia, unspecified: Secondary | ICD-10-CM

## 2019-05-02 MED ORDER — QUETIAPINE FUMARATE 100 MG PO TABS: 100.0000 mg | ORAL_TABLET | Freq: Every day | ORAL | 2 refills | Status: DC

## 2019-05-02 NOTE — Telephone Encounter (Signed)
Pt would like to speak with JC concerning her medication. Did not specify on voicemail.

## 2019-06-05 ENCOUNTER — Ambulatory Visit: Payer: 59 | Admitting: Psychiatry

## 2019-06-17 ENCOUNTER — Telehealth: Payer: Self-pay | Admitting: Psychiatry

## 2019-06-17 ENCOUNTER — Other Ambulatory Visit: Payer: Self-pay

## 2019-06-17 MED ORDER — ALPRAZOLAM 0.5 MG PO TABS: 0.5000 mg | ORAL_TABLET | Freq: Every evening | ORAL | 0 refills | Status: DC | PRN

## 2019-06-17 NOTE — Telephone Encounter (Signed)
Refill called in. 

## 2019-06-17 NOTE — Telephone Encounter (Signed)
Pt needs refill on Alprazolam sent to [facility] on [address].

## 2019-07-23 ENCOUNTER — Telehealth: Payer: Self-pay | Admitting: Psychiatry

## 2019-07-23 ENCOUNTER — Other Ambulatory Visit: Payer: Self-pay | Admitting: Psychiatry

## 2019-07-23 NOTE — Telephone Encounter (Signed)
Last appt in May, nothing scheduled

## 2019-07-23 NOTE — Telephone Encounter (Signed)
Patient called and said that she needs a refill on her xanax.5 mg . She would like a more than a 30 day supply for all of her medicines. Please send in the refill to [facility] on [address]

## 2019-07-23 NOTE — Telephone Encounter (Signed)
See phone message

## 2019-07-24 ENCOUNTER — Other Ambulatory Visit: Payer: Self-pay | Admitting: Psychiatry

## 2019-07-24 DIAGNOSIS — G4700 Insomnia, unspecified: Secondary | ICD-10-CM

## 2019-07-24 DIAGNOSIS — F39 Unspecified mood [affective] disorder: Secondary | ICD-10-CM

## 2019-07-25 NOTE — Telephone Encounter (Signed)
Last appt May, still nothing scheduled

## 2019-07-29 NOTE — Telephone Encounter (Signed)
Call patient to get scheduled with either me or Janett Billow in order for her to get refills.

## 2019-07-30 NOTE — Telephone Encounter (Signed)
Left a message gfor pt to schedule appointment

## 2019-08-13 ENCOUNTER — Other Ambulatory Visit: Payer: Self-pay | Admitting: Psychiatry

## 2019-08-13 DIAGNOSIS — G4700 Insomnia, unspecified: Secondary | ICD-10-CM

## 2019-08-13 DIAGNOSIS — F411 Generalized anxiety disorder: Secondary | ICD-10-CM

## 2019-08-13 DIAGNOSIS — F39 Unspecified mood [affective] disorder: Secondary | ICD-10-CM

## 2019-08-14 NOTE — Telephone Encounter (Signed)
Has appt 09/02/2019 Refill due 08/23/2019

## 2019-08-16 ENCOUNTER — Telehealth: Payer: Self-pay | Admitting: Psychiatry

## 2019-08-16 NOTE — Telephone Encounter (Signed)
L/m on pharmacy's prescriber voicemail to request that patient be allowed to fill prescription early on 08/21/2019 due to patient going out of town.

## 2019-08-16 NOTE — Telephone Encounter (Signed)
Patient going out of town and  she has an appointment scheduled on 10/19 she needs approval to fill the xanax early since they won't fill until the 10/9.Please call the pharmacy to fill it early

## 2019-08-21 ENCOUNTER — Telehealth: Payer: Self-pay | Admitting: Psychiatry

## 2019-08-21 NOTE — Telephone Encounter (Signed)
[place] pharmacy to confirm her alprazolam can be filled today 08/21/2019

## 2019-08-21 NOTE — Telephone Encounter (Signed)
Pt called back to say that the pharmacy did not get a voicemail for her to pick up her rx for Alprazolam early. Pt is going out of town today.

## 2019-09-02 ENCOUNTER — Encounter: Payer: Self-pay | Admitting: Psychiatry

## 2019-09-02 ENCOUNTER — Other Ambulatory Visit: Payer: Self-pay

## 2019-09-02 ENCOUNTER — Ambulatory Visit (INDEPENDENT_AMBULATORY_CARE_PROVIDER_SITE_OTHER): Payer: 59 | Admitting: Psychiatry

## 2019-09-02 DIAGNOSIS — F411 Generalized anxiety disorder: Secondary | ICD-10-CM | POA: Diagnosis not present

## 2019-09-02 DIAGNOSIS — F39 Unspecified mood [affective] disorder: Secondary | ICD-10-CM | POA: Diagnosis not present

## 2019-09-02 DIAGNOSIS — G4700 Insomnia, unspecified: Secondary | ICD-10-CM | POA: Diagnosis not present

## 2019-09-02 MED ORDER — QUETIAPINE FUMARATE 100 MG PO TABS: 100.0000 mg | ORAL_TABLET | Freq: Every day | ORAL | 1 refills | Status: DC

## 2019-09-02 MED ORDER — ALPRAZOLAM 0.5 MG PO TABS: 0.5000 mg | ORAL_TABLET | Freq: Every day | ORAL | 5 refills | Status: DC | PRN

## 2019-09-02 MED ORDER — DULOXETINE HCL 60 MG PO CPEP: 60.0000 mg | ORAL_CAPSULE | Freq: Every day | ORAL | 1 refills | Status: DC

## 2019-09-02 NOTE — Progress Notes (Signed)
Tracy ItoShana Lean [id number] 27-Sep-1976 43 y.o.  Subjective:   Patient ID:  Tracy Lambert is a 43 y.o. (DOB 27-Sep-1976) female.  Chief Complaint:  Chief Complaint  Patient presents with  . Follow-up    h/o Anxiety, Depression, and insomnia    HPI Tracy Lambert presents to the office today for follow-up of anxiety and mood. She reports occ anxiety in the form of anxious thoughts. She reports that her mood has been ok with some PMDD s/s about 2 weeks prior to onset of menses with depressed and irritable mood. She reports that her energy and mood have been stable overall with some decrease before her menses. She reports that Seroquel is helpful for her sleep. Averages 6-8 hours of sleep a night. Appetite has been stable. Denies impaired concentration. Denies SI.   Denies impulsive or risky behavior.   She has a 489 yo son who is doing remote learning. Works at [employer] in [employer].   Past Psychiatric Medication Trials: Cymbalta Zoloft  Prozac- Not effective Buspar Seroquel- Reports that she felt over medicated on higher doses of Seroquel (possibly 200 mg) Xanax Phentermine- increased anxiety, tension   Review of Systems:  Review of Systems  Musculoskeletal: Negative for gait problem.  Neurological: Negative for tremors.  Psychiatric/Behavioral:       Please refer to HPI    Medications: I have reviewed the patient's current medications.  Current Outpatient Medications  Medication Sig Dispense Refill  . [START ON 09/18/2019] ALPRAZolam (XANAX) 0.5 MG tablet Take 1 tablet (0.5 mg total) by mouth daily as needed for anxiety. 30 tablet 5  . DULoxetine (CYMBALTA) 60 MG capsule Take 1 capsule (60 mg total) by mouth daily. 90 capsule 1  . QUEtiapine (SEROQUEL) 100 MG tablet Take 1 tablet (100 mg total) by mouth at bedtime. 90 tablet 1   No current facility-administered medications for this visit.     Medication Side Effects: None  Allergies: No Known Allergies  History  reviewed. No pertinent past medical history.  Family History  Problem Relation Age of Onset  . Arthritis Mother   . Alcohol abuse Mother     Social History   Socioeconomic History  . Marital status: Married    Spouse name: Not on file  . Number of children: Not on file  . Years of education: Not on file  . Highest education level: Not on file  Occupational History  . Not on file  Social Needs  . Financial resource strain: Not on file  . Food insecurity    Worry: Not on file    Inability: Not on file  . Transportation needs    Medical: Not on file    Non-medical: Not on file  Tobacco Use  . Smoking status: Never Smoker  . Smokeless tobacco: Never Used  Substance and Sexual Activity  . Alcohol use: Yes    Comment: Occ  . Drug use: No  . Sexual activity: Not on file  Lifestyle  . Physical activity    Days per week: Not on file    Minutes per session: Not on file  . Stress: Not on file  Relationships  . Social Musicianconnections    Talks on phone: Not on file    Gets together: Not on file    Attends religious service: Not on file    Active member of club or organization: Not on file    Attends meetings of clubs or organizations: Not on file    Relationship status: Not on  file  . Intimate partner violence    Fear of current or ex partner: Not on file    Emotionally abused: Not on file    Physically abused: Not on file    Forced sexual activity: Not on file  Other Topics Concern  . Not on file  Social History Narrative  . Not on file    Past Medical History, Surgical history, Social history, and Family history were reviewed and updated as appropriate.   Please see review of systems for further details on the patient's review from today.   Objective:   Physical Exam:  BP (!) 141/100   Pulse 83   Wt 176 lb (79.8 kg)   BMI 27.57 kg/m   Physical Exam Constitutional:      General: She is not in acute distress.    Appearance: She is well-developed.   Musculoskeletal:        General: No deformity.  Neurological:     Mental Status: She is alert and oriented to person, place, and time.     Coordination: Coordination normal.  Psychiatric:        Attention and Perception: Attention and perception normal. She does not perceive auditory or visual hallucinations.        Mood and Affect: Mood is not anxious or depressed. Affect is blunt. Affect is not labile, angry or inappropriate.        Speech: Speech normal.        Behavior: Behavior normal.        Thought Content: Thought content normal. Thought content does not include homicidal or suicidal ideation. Thought content does not include homicidal or suicidal plan.        Cognition and Memory: Cognition and memory normal.        Judgment: Judgment normal.     Comments: Insight intact. No delusions.      Lab Review:     Component Value Date/Time   NA 144 02/18/2016 2200   K 3.8 02/18/2016 2200   CL 106 02/18/2016 2200   CO2 25 02/18/2016 2200   GLUCOSE 108 (H) 02/18/2016 2200   BUN 10 02/18/2016 2200   CREATININE 0.74 02/18/2016 2200   CALCIUM 9.0 02/18/2016 2200   PROT 8.0 02/18/2016 2200   ALBUMIN 4.8 02/18/2016 2200   AST 27 02/18/2016 2200   ALT 30 02/18/2016 2200   ALKPHOS 53 02/18/2016 2200   BILITOT 0.3 02/18/2016 2200   GFRNONAA >60 02/18/2016 2200   GFRAA >60 02/18/2016 2200       Component Value Date/Time   WBC 7.6 02/18/2016 2200   RBC 4.70 02/18/2016 2200   HGB 14.5 02/18/2016 2200   HCT 42.2 02/18/2016 2200   PLT 346 02/18/2016 2200   MCV 89.8 02/18/2016 2200   MCH 30.9 02/18/2016 2200   MCHC 34.4 02/18/2016 2200   RDW 13.0 02/18/2016 2200    No results found for: POCLITH, LITHIUM   No results found for: PHENYTOIN, PHENOBARB, VALPROATE, CBMZ   .res Assessment: Plan:   Reviewed potential risks, benefits, and side effects with Seroquel to include potential metabolic side effects.  Discussed monitoring glucose and lipid panel.  Discussed that this  provider could order labs or patient may be want to schedule yearly physical exam and have labs included in part of wellness benefit.  Patient indicates that she will try to schedule annual physical exam and will notify this provider when labs have been obtained. Discussed patient's desire to start psychotherapy and discussed possible referral  options based on treatment goals.  Patient assisted with scheduling appointment with Rockne Menghini, LCSW. Patient to follow-up in 6 months or sooner if clinically indicated. Patient advised to contact office with any questions, adverse effects, or acute worsening in signs and symptoms.  Fumie was seen today for follow-up.  Diagnoses and all orders for this visit:  Episodic mood disorder (HCC) -     DULoxetine (CYMBALTA) 60 MG capsule; Take 1 capsule (60 mg total) by mouth daily. -     QUEtiapine (SEROQUEL) 100 MG tablet; Take 1 tablet (100 mg total) by mouth at bedtime.  Anxiety state -     ALPRAZolam (XANAX) 0.5 MG tablet; Take 1 tablet (0.5 mg total) by mouth daily as needed for anxiety. -     DULoxetine (CYMBALTA) 60 MG capsule; Take 1 capsule (60 mg total) by mouth daily.  Insomnia, unspecified type -     QUEtiapine (SEROQUEL) 100 MG tablet; Take 1 tablet (100 mg total) by mouth at bedtime.     Please see After Visit Summary for patient specific instructions.  No future appointments.  No orders of the defined types were placed in this encounter.   -------------------------------

## 2019-09-04 ENCOUNTER — Ambulatory Visit: Payer: 59 | Admitting: Psychiatry

## 2019-12-08 ENCOUNTER — Other Ambulatory Visit: Payer: Self-pay | Admitting: Psychiatry

## 2019-12-08 DIAGNOSIS — G4700 Insomnia, unspecified: Secondary | ICD-10-CM

## 2019-12-08 DIAGNOSIS — F39 Unspecified mood [affective] disorder: Secondary | ICD-10-CM

## 2020-01-28 ENCOUNTER — Other Ambulatory Visit: Payer: Self-pay | Admitting: Psychiatry

## 2020-01-28 DIAGNOSIS — G4700 Insomnia, unspecified: Secondary | ICD-10-CM

## 2020-01-28 DIAGNOSIS — F411 Generalized anxiety disorder: Secondary | ICD-10-CM

## 2020-01-28 DIAGNOSIS — F39 Unspecified mood [affective] disorder: Secondary | ICD-10-CM

## 2020-02-10 ENCOUNTER — Telehealth: Payer: Self-pay | Admitting: Psychiatry

## 2020-02-10 DIAGNOSIS — F39 Unspecified mood [affective] disorder: Secondary | ICD-10-CM

## 2020-02-10 DIAGNOSIS — G4700 Insomnia, unspecified: Secondary | ICD-10-CM

## 2020-02-10 MED ORDER — QUETIAPINE FUMARATE 50 MG PO TABS: ORAL_TABLET | ORAL | 1 refills | Status: DC

## 2020-02-10 NOTE — Telephone Encounter (Signed)
Rec'd clarification request from pharmacy if pt is taking one 100 mg tab or 2 tabs as she reported to them. Pt previously on 50 mg QHS and was taking 2 tabs and script was changed to 100 mg. Can change back to 50 mg 1-2 tabs if pt prefers. Haven't discussed increase to 200 mg QHS with pt since she had previously reported that it caused side effects.

## 2020-03-02 ENCOUNTER — Ambulatory Visit (INDEPENDENT_AMBULATORY_CARE_PROVIDER_SITE_OTHER): Payer: 59 | Admitting: Psychiatry

## 2020-03-02 ENCOUNTER — Encounter: Payer: Self-pay | Admitting: Psychiatry

## 2020-03-02 ENCOUNTER — Other Ambulatory Visit: Payer: Self-pay

## 2020-03-02 DIAGNOSIS — G4700 Insomnia, unspecified: Secondary | ICD-10-CM | POA: Diagnosis not present

## 2020-03-02 DIAGNOSIS — F411 Generalized anxiety disorder: Secondary | ICD-10-CM | POA: Diagnosis not present

## 2020-03-02 DIAGNOSIS — F39 Unspecified mood [affective] disorder: Secondary | ICD-10-CM

## 2020-03-02 MED ORDER — DULOXETINE HCL 60 MG PO CPEP: 60.0000 mg | ORAL_CAPSULE | Freq: Every day | ORAL | 0 refills | Status: DC

## 2020-03-02 MED ORDER — QUETIAPINE FUMARATE 100 MG PO TABS: 100.0000 mg | ORAL_TABLET | Freq: Every day | ORAL | 0 refills | Status: DC

## 2020-03-02 MED ORDER — ALPRAZOLAM 0.5 MG PO TABS: 0.5000 mg | ORAL_TABLET | Freq: Two times a day (BID) | ORAL | 1 refills | Status: DC | PRN

## 2020-03-02 MED ORDER — TOPIRAMATE 25 MG PO TABS: ORAL_TABLET | ORAL | 1 refills | Status: DC

## 2020-03-02 NOTE — Progress Notes (Signed)
Tracy Lambert [id number] 10-01-1976 44 y.o.  Subjective:   Patient ID:  Tracy Lambert is a 44 y.o. (DOB [birth year]) female.  Chief Complaint:  Chief Complaint  Patient presents with  . Alcohol Problem  . Insomnia  . Anxiety    HPI Tracy Lambert presents to the office today for follow-up of anxiety and depression. No ETOH for 15-16 days. Denies cravings to drinks. She reports some triggers to want to drink, such as making a big sale.  Notices pattern of drinking alcohol when things have been going well overall.  She reports that she is interested in starting treatment with a therapist since she has not found AA to be helpful in the past.  She reports that Seroquel is helpful for insomnia. Taking Seroquel 100 mg around 8-9:30 pm. Adequate sleep with Seroquel. She reports that she typically takes Xanax when she fist gets home from work. She reports that she is taking additional Xanax prn.Reports episodes of acute anxiety. Reports that Xanax 0.5 mg is minimally effective for acute anxiety. She reports that her mood has been "good." Had increased depression with ETOH use. She reports that she has some mild irritability at baseline. She reports long-standing mood changes around menses. She reports that her concentration has been improved since stopping ETOH. Appetite has been stable. Denies SI.   Estranged from mother for 2 years who she describes as narcissistic, Bipolar, and h/o substance use.   Father has dementia and this has been difficult for her.   Reports h/o withdrawal s/s several years ago. Reports shaking for one day after stopping ETOH most recently.   Past Psychiatric Medication Trials: Cymbalta Zoloft  Prozac- Not effective Buspar Seroquel- Reports that she felt over medicated on higher doses of Seroquel (possibly 200 mg) Xanax Phentermine- increased anxiety, tension  Review of Systems:  Review of Systems  Musculoskeletal: Negative for gait problem.  Neurological: Negative  for tremors.       HA's have improved some recently  Psychiatric/Behavioral:       Please refer to HPI    Medications: I have reviewed the patient's current medications.  Current Outpatient Medications  Medication Sig Dispense Refill  . ALPRAZolam (XANAX) 0.5 MG tablet Take 1 tablet (0.5 mg total) by mouth 2 (two) times daily as needed for anxiety. 45 tablet 1  . DULoxetine (CYMBALTA) 60 MG capsule Take 1 capsule (60 mg total) by mouth daily. 90 capsule 0  . QUEtiapine (SEROQUEL) 100 MG tablet Take 1 tablet (100 mg total) by mouth at bedtime. 30 tablet 0  . topiramate (TOPAMAX) 25 MG tablet Take 1 tab po QHS x 1 week, then may increase to 1 tab po BID 60 tablet 1   No current facility-administered medications for this visit.    Medication Side Effects: None  Allergies: No Known Allergies  History reviewed. No pertinent past medical history.  Family History  Problem Relation Age of Onset  . Arthritis Mother   . Alcohol abuse Mother     Social History   Socioeconomic History  . Marital status: Married    Spouse name: Not on file  . Number of children: Not on file  . Years of education: Not on file  . Highest education level: Not on file  Occupational History  . Not on file  Tobacco Use  . Smoking status: Never Smoker  . Smokeless tobacco: Never Used  Substance and Sexual Activity  . Alcohol use: Yes    Comment: Occ  . Drug use: No  .  Sexual activity: Not on file  Other Topics Concern  . Not on file  Social History Narrative  . Not on file   Social Determinants of Health   Financial Resource Strain:   . Difficulty of Paying Living Expenses:   Food Insecurity:   . Worried About Programme researcher, broadcasting/film/video in the Last Year:   . Barista in the Last Year:   Transportation Needs:   . Freight forwarder (Medical):   Marland Kitchen Lack of Transportation (Non-Medical):   Physical Activity:   . Days of Exercise per Week:   . Minutes of Exercise per Session:   Stress:    . Feeling of Stress :   Social Connections:   . Frequency of Communication with Friends and Family:   . Frequency of Social Gatherings with Friends and Family:   . Attends Religious Services:   . Active Member of Clubs or Organizations:   . Attends Banker Meetings:   Marland Kitchen Marital Status:   Intimate Partner Violence:   . Fear of Current or Ex-Partner:   . Emotionally Abused:   Marland Kitchen Physically Abused:   . Sexually Abused:     Past Medical History, Surgical history, Social history, and Family history were reviewed and updated as appropriate.   Please see review of systems for further details on the patient's review from today.   Objective:   Physical Exam:  BP (!) 145/112   Pulse 80   Physical Exam Constitutional:      General: She is not in acute distress. Musculoskeletal:        General: No deformity.  Neurological:     Mental Status: She is alert and oriented to person, place, and time.     Coordination: Coordination normal.  Psychiatric:        Attention and Perception: Attention and perception normal. She does not perceive auditory or visual hallucinations.        Mood and Affect: Mood is anxious. Mood is not depressed. Affect is not labile, blunt, angry or inappropriate.        Speech: Speech normal.        Behavior: Behavior normal.        Thought Content: Thought content normal. Thought content is not paranoid or delusional. Thought content does not include homicidal or suicidal ideation. Thought content does not include homicidal or suicidal plan.        Cognition and Memory: Cognition and memory normal.        Judgment: Judgment normal.     Comments: Insight intact     Lab Review:     Component Value Date/Time   NA 144 02/18/2016 2200   K 3.8 02/18/2016 2200   CL 106 02/18/2016 2200   CO2 25 02/18/2016 2200   GLUCOSE 108 (H) 02/18/2016 2200   BUN 10 02/18/2016 2200   CREATININE 0.74 02/18/2016 2200   CALCIUM 9.0 02/18/2016 2200   PROT 8.0  02/18/2016 2200   ALBUMIN 4.8 02/18/2016 2200   AST 27 02/18/2016 2200   ALT 30 02/18/2016 2200   ALKPHOS 53 02/18/2016 2200   BILITOT 0.3 02/18/2016 2200   GFRNONAA >60 02/18/2016 2200   GFRAA >60 02/18/2016 2200       Component Value Date/Time   WBC 7.6 02/18/2016 2200   RBC 4.70 02/18/2016 2200   HGB 14.5 02/18/2016 2200   HCT 42.2 02/18/2016 2200   PLT 346 02/18/2016 2200   MCV 89.8 02/18/2016 2200   MCH  30.9 02/18/2016 2200   MCHC 34.4 02/18/2016 2200   RDW 13.0 02/18/2016 2200    No results found for: POCLITH, LITHIUM   No results found for: PHENYTOIN, PHENOBARB, VALPROATE, CBMZ   .res Assessment: Plan:   Patient seen for 30 minutes and discussed possible referrals and strategies to maintaining sobriety.  Referred patient to Beckey Downing, LCAS. Discussed potential benefits, risks, and side effects of Topamax and discussed that Topamax is used off label for anxiety and alcohol cravings, and is also indicated for headaches which she also experiences.  Patient agrees to trial of Topamax.  Discussed stopping Topamax if she develops any cognitive side effects.  Start Topamax 25 mg at bedtime for 1 week, then increase to 25 mg twice daily. Continue Cymbalta 60 mg daily for anxiety and mood signs and symptoms. Continue Seroquel 100 mg at bedtime for insomnia and mood. Continue alprazolam 0.5 mg as needed for acute anxiety with goal to discontinue once acute anxiety improves. Patient to follow-up in 4 weeks or sooner if clinically indicated. Patient advised to contact office with any questions, adverse effects, or acute worsening in signs and symptoms.  Deanna was seen today for alcohol problem, insomnia and anxiety.  Diagnoses and all orders for this visit:  Episodic mood disorder (HCC) -     DULoxetine (CYMBALTA) 60 MG capsule; Take 1 capsule (60 mg total) by mouth daily. -     QUEtiapine (SEROQUEL) 100 MG tablet; Take 1 tablet (100 mg total) by mouth at bedtime.  Anxiety  state -     topiramate (TOPAMAX) 25 MG tablet; Take 1 tab po QHS x 1 week, then may increase to 1 tab po BID -     DULoxetine (CYMBALTA) 60 MG capsule; Take 1 capsule (60 mg total) by mouth daily. -     ALPRAZolam (XANAX) 0.5 MG tablet; Take 1 tablet (0.5 mg total) by mouth 2 (two) times daily as needed for anxiety.  Insomnia, unspecified type -     QUEtiapine (SEROQUEL) 100 MG tablet; Take 1 tablet (100 mg total) by mouth at bedtime.     Please see After Visit Summary for patient specific instructions.  Future Appointments  Date Time Provider [place]  03/30/2020  9:00 AM Thayer Headings, PMHNP [facility] None    No orders of the defined types were placed in this encounter.   -------------------------------

## 2020-03-30 ENCOUNTER — Ambulatory Visit: Payer: 59 | Admitting: Psychiatry

## 2020-04-09 ENCOUNTER — Other Ambulatory Visit: Payer: Self-pay

## 2020-04-09 ENCOUNTER — Ambulatory Visit (INDEPENDENT_AMBULATORY_CARE_PROVIDER_SITE_OTHER): Payer: 59 | Admitting: Psychiatry

## 2020-04-09 ENCOUNTER — Encounter: Payer: Self-pay | Admitting: Psychiatry

## 2020-04-09 VITALS — BP 126/83 | HR 82

## 2020-04-09 DIAGNOSIS — F39 Unspecified mood [affective] disorder: Secondary | ICD-10-CM

## 2020-04-09 DIAGNOSIS — G4700 Insomnia, unspecified: Secondary | ICD-10-CM | POA: Diagnosis not present

## 2020-04-09 DIAGNOSIS — G43809 Other migraine, not intractable, without status migrainosus: Secondary | ICD-10-CM

## 2020-04-09 DIAGNOSIS — F411 Generalized anxiety disorder: Secondary | ICD-10-CM

## 2020-04-09 MED ORDER — ALPRAZOLAM 0.5 MG PO TABS: 0.5000 mg | ORAL_TABLET | Freq: Two times a day (BID) | ORAL | 2 refills | Status: DC | PRN

## 2020-04-09 MED ORDER — QUETIAPINE FUMARATE 100 MG PO TABS: 100.0000 mg | ORAL_TABLET | Freq: Every day | ORAL | 0 refills | Status: DC

## 2020-04-09 MED ORDER — TOPIRAMATE 50 MG PO TABS: 50.0000 mg | ORAL_TABLET | Freq: Every day | ORAL | 0 refills | Status: DC

## 2020-04-09 MED ORDER — SUMATRIPTAN SUCCINATE 50 MG PO TABS: 50.0000 mg | ORAL_TABLET | ORAL | 0 refills | Status: DC | PRN

## 2020-04-09 MED ORDER — DULOXETINE HCL 60 MG PO CPEP: 60.0000 mg | ORAL_CAPSULE | Freq: Every day | ORAL | 0 refills | Status: DC

## 2020-04-09 NOTE — Progress Notes (Signed)
Tracy Lambert [id number] 1976/01/30 44 y.o.  Subjective:   Patient ID:  Tracy Lambert is a 44 y.o. (DOB [birth year]) female.  Chief Complaint:  Chief Complaint  Patient presents with  . Follow-up    Anxiety, Depression, and insomnia    HPI Tracy Lambert presents to the office today for follow-up of anxiety, depression, insomnia, and h/o ETOH misuse. She reports that she has been doing "ok" overall. She reports that she has not drank since last visit. She reports that she has not had cravings to drink aside from some days where she felt like she was in a mood where she can drink. Reports that she has some situational anxiety currently but overall anxiety has been ok. Reports that Xanax and Topamax have helped with anxiety. She reports that her mood has been "good." Had some irritability with recent stomach bug. Reports that irritability has not been very bad. Had stable mood without drinking through last menstrual cycle. Sleeping well and averaging 8 hours a night. Appetite has been slightly decreased with Topamax. She reports that her energy and motivation have been lower with recent GI illness. Energy and motivation have been returning. Concentration has been adequate. Denies SI.   Typically migraines will start when she wakes up and last about a day. Typically affects one side of her face. Occurs about every 2 weeks.   She has family coming in this weekend. Son was calling her several times from his Dad's saying he was not feeling well. Has decreased caffeine use. She reports that her work has been going well.    Past Psychiatric Medication Trials: Cymbalta Zoloft  Prozac- Not effective Buspar Seroquel- Reports that she felt over medicated on higher doses of Seroquel (possibly 200 mg) Xanax Phentermine- increased anxiety, tension Topamax   Review of Systems:  Review of Systems  Gastrointestinal:       Recent acute GI illness/ possible food poisoning  Musculoskeletal: Negative for  gait problem.  Neurological: Negative for tremors.       Reports having mini-migraines about every 2 weeks. She reports that she has had one migraine in about 50 days. Reports that these have been long-standing.  Psychiatric/Behavioral:       Please refer to HPI    Medications: I have reviewed the patient's current medications.  Current Outpatient Medications  Medication Sig Dispense Refill  . [START ON 04/30/2020] ALPRAZolam (XANAX) 0.5 MG tablet Take 1 tablet (0.5 mg total) by mouth 2 (two) times daily as needed for anxiety. 45 tablet 2  . DULoxetine (CYMBALTA) 60 MG capsule Take 1 capsule (60 mg total) by mouth daily. 90 capsule 0  . topiramate (TOPAMAX) 50 MG tablet Take 1 tablet (50 mg total) by mouth at bedtime. 90 tablet 0  . QUEtiapine (SEROQUEL) 100 MG tablet Take 1 tablet (100 mg total) by mouth at bedtime. 90 tablet 0  . SUMAtriptan (IMITREX) 50 MG tablet Take 1 tablet (50 mg total) by mouth every 2 (two) hours as needed for migraine. May repeat in 2 hours if headache persists or recurs. 10 tablet 0   No current facility-administered medications for this visit.    Medication Side Effects: None  Allergies: No Known Allergies  Past Medical History:  Diagnosis Date  . Migraines     Family History  Problem Relation Age of Onset  . Arthritis Mother   . Alcohol abuse Mother     Social History   Socioeconomic History  . Marital status: Married    Spouse name:  Not on file  . Number of children: Not on file  . Years of education: Not on file  . Highest education level: Not on file  Occupational History  . Not on file  Tobacco Use  . Smoking status: Never Smoker  . Smokeless tobacco: Never Used  Substance and Sexual Activity  . Alcohol use: Yes    Comment: Occ  . Drug use: No  . Sexual activity: Not on file  Other Topics Concern  . Not on file  Social History Narrative  . Not on file   Social Determinants of Health   Financial Resource Strain:   .  Difficulty of Paying Living Expenses:   Food Insecurity:   . Worried About Programme researcher, broadcasting/film/video in the Last Year:   . Barista in the Last Year:   Transportation Needs:   . Freight forwarder (Medical):   Marland Kitchen Lack of Transportation (Non-Medical):   Physical Activity:   . Days of Exercise per Week:   . Minutes of Exercise per Session:   Stress:   . Feeling of Stress :   Social Connections:   . Frequency of Communication with Friends and Family:   . Frequency of Social Gatherings with Friends and Family:   . Attends Religious Services:   . Active Member of Clubs or Organizations:   . Attends Banker Meetings:   Marland Kitchen Marital Status:   Intimate Partner Violence:   . Fear of Current or Ex-Partner:   . Emotionally Abused:   Marland Kitchen Physically Abused:   . Sexually Abused:     Past Medical History, Surgical history, Social history, and Family history were reviewed and updated as appropriate.   Please see review of systems for further details on the patient's review from today.   Objective:   Physical Exam:  BP 126/83   Pulse 82   Physical Exam Constitutional:      General: She is not in acute distress. Musculoskeletal:        General: No deformity.  Neurological:     Mental Status: She is alert and oriented to person, place, and time.     Coordination: Coordination normal.  Psychiatric:        Attention and Perception: Attention and perception normal. She does not perceive auditory or visual hallucinations.        Mood and Affect: Mood normal. Mood is not anxious or depressed. Affect is not labile, blunt, angry or inappropriate.        Speech: Speech normal.        Behavior: Behavior normal.        Thought Content: Thought content normal. Thought content is not paranoid or delusional. Thought content does not include homicidal or suicidal ideation. Thought content does not include homicidal or suicidal plan.        Cognition and Memory: Cognition and memory  normal.        Judgment: Judgment normal.     Comments: Insight intact     Lab Review:     Component Value Date/Time   NA 144 02/18/2016 2200   K 3.8 02/18/2016 2200   CL 106 02/18/2016 2200   CO2 25 02/18/2016 2200   GLUCOSE 108 (H) 02/18/2016 2200   BUN 10 02/18/2016 2200   CREATININE 0.74 02/18/2016 2200   CALCIUM 9.0 02/18/2016 2200   PROT 8.0 02/18/2016 2200   ALBUMIN 4.8 02/18/2016 2200   AST 27 02/18/2016 2200   ALT 30 02/18/2016 2200  ALKPHOS 53 02/18/2016 2200   BILITOT 0.3 02/18/2016 2200   GFRNONAA >60 02/18/2016 2200   GFRAA >60 02/18/2016 2200       Component Value Date/Time   WBC 7.6 02/18/2016 2200   RBC 4.70 02/18/2016 2200   HGB 14.5 02/18/2016 2200   HCT 42.2 02/18/2016 2200   PLT 346 02/18/2016 2200   MCV 89.8 02/18/2016 2200   MCH 30.9 02/18/2016 2200   MCHC 34.4 02/18/2016 2200   RDW 13.0 02/18/2016 2200    No results found for: POCLITH, LITHIUM   No results found for: PHENYTOIN, PHENOBARB, VALPROATE, CBMZ   .res Assessment: Plan:   Discussed potential benefits, risks, and side effects of Imitrex for migraines. Pt agrees to trial of Imitex. Will start Sumatriptan 50 mg po q 2 hours prn migraine. Continue Topamax 50 mg po QHS for insomnia, anxiety, and mood.  Continue Cymbalta 60 mg po qd for depression and anxiety.  Continue Seroquel 100 mg po QHS for insomnia and mood.  Continue Xanax prn anxiety.  Pt requests contact information again for Bradley Ferris, LCAS and this was given to pt. Pt to f/u in 3 months or sooner if clinically indicated.  Patient advised to contact office with any questions, adverse effects, or acute worsening in signs and symptoms.  Tracy Lambert was seen today for follow-up.  Diagnoses and all orders for this visit:  Other migraine without status migrainosus, not intractable -     topiramate (TOPAMAX) 50 MG tablet; Take 1 tablet (50 mg total) by mouth at bedtime. -     SUMAtriptan (IMITREX) 50 MG tablet; Take 1 tablet (50  mg total) by mouth every 2 (two) hours as needed for migraine. May repeat in 2 hours if headache persists or recurs.  Anxiety state -     topiramate (TOPAMAX) 50 MG tablet; Take 1 tablet (50 mg total) by mouth at bedtime. -     DULoxetine (CYMBALTA) 60 MG capsule; Take 1 capsule (60 mg total) by mouth daily. -     ALPRAZolam (XANAX) 0.5 MG tablet; Take 1 tablet (0.5 mg total) by mouth 2 (two) times daily as needed for anxiety.  Episodic mood disorder (HCC) -     QUEtiapine (SEROQUEL) 100 MG tablet; Take 1 tablet (100 mg total) by mouth at bedtime. -     DULoxetine (CYMBALTA) 60 MG capsule; Take 1 capsule (60 mg total) by mouth daily.  Insomnia, unspecified type -     QUEtiapine (SEROQUEL) 100 MG tablet; Take 1 tablet (100 mg total) by mouth at bedtime.     Please see After Visit Summary for patient specific instructions.  Future Appointments  Date Time Provider Department Center  07/10/2020  8:30 AM Corie Chiquito, PMHNP [facility] None    No orders of the defined types were placed in this encounter.   -------------------------------

## 2020-06-21 ENCOUNTER — Other Ambulatory Visit: Payer: Self-pay | Admitting: Psychiatry

## 2020-06-21 DIAGNOSIS — F411 Generalized anxiety disorder: Secondary | ICD-10-CM

## 2020-06-22 NOTE — Telephone Encounter (Signed)
Last fill 07/28 Has upcoming apt 08/26  Not sure how often you want her to get refills?

## 2020-06-29 ENCOUNTER — Other Ambulatory Visit: Payer: Self-pay | Admitting: Psychiatry

## 2020-06-29 DIAGNOSIS — F411 Generalized anxiety disorder: Secondary | ICD-10-CM

## 2020-06-29 NOTE — Telephone Encounter (Signed)
Next apt 08/27

## 2020-07-10 ENCOUNTER — Other Ambulatory Visit: Payer: Self-pay

## 2020-07-10 ENCOUNTER — Encounter: Payer: Self-pay | Admitting: Psychiatry

## 2020-07-10 ENCOUNTER — Ambulatory Visit (INDEPENDENT_AMBULATORY_CARE_PROVIDER_SITE_OTHER): Payer: 59 | Admitting: Psychiatry

## 2020-07-10 DIAGNOSIS — G4700 Insomnia, unspecified: Secondary | ICD-10-CM

## 2020-07-10 DIAGNOSIS — F411 Generalized anxiety disorder: Secondary | ICD-10-CM

## 2020-07-10 DIAGNOSIS — F39 Unspecified mood [affective] disorder: Secondary | ICD-10-CM | POA: Diagnosis not present

## 2020-07-10 DIAGNOSIS — G43809 Other migraine, not intractable, without status migrainosus: Secondary | ICD-10-CM

## 2020-07-10 MED ORDER — TOPIRAMATE 50 MG PO TABS: 50.0000 mg | ORAL_TABLET | Freq: Every day | ORAL | 0 refills | Status: DC

## 2020-07-10 MED ORDER — PROPRANOLOL HCL 10 MG PO TABS: ORAL_TABLET | ORAL | 1 refills | Status: DC

## 2020-07-10 MED ORDER — DULOXETINE HCL 60 MG PO CPEP: 60.0000 mg | ORAL_CAPSULE | Freq: Every day | ORAL | 0 refills | Status: DC

## 2020-07-10 MED ORDER — SUMATRIPTAN SUCCINATE 50 MG PO TABS: 50.0000 mg | ORAL_TABLET | ORAL | 2 refills | Status: DC | PRN

## 2020-07-10 MED ORDER — ALPRAZOLAM 0.5 MG PO TABS: ORAL_TABLET | ORAL | 2 refills | Status: DC

## 2020-07-10 MED ORDER — QUETIAPINE FUMARATE 50 MG PO TABS: ORAL_TABLET | ORAL | 0 refills | Status: DC

## 2020-07-10 NOTE — Progress Notes (Signed)
Tracy Lambert [id number] 30-Mar-1976 44 y.o.  Subjective:   Patient ID:  Tracy Lambert is a 44 y.o. (DOB February 25, 1976) female.  Chief Complaint:  Chief Complaint  Patient presents with  . Anxiety  . Follow-up    Insomnia, mood disturbance, EtOH misuse    HPI Tracy Lambert presents to the office today for follow-up of anxiety, insomnia, and mood disturbance. She reports that she has had some work stress. She reports that she usually will take Xanax after work. Denies any recent panic attacks. Mood has been ok. Sleeping ok. Occasional insomnia if she does not take Seroquel. Appetite has been ok. Energy and motivation have been ok. Concentration has been fair. She reports that irritability is less with Topamax. Denies SI.   Father has dementia. She reports that he has had a quick decline and recalls 2 years ago having a high level conversation with her father and now he has care.   She reports that she was drinking and is now trying to abstain and has started [employer] Has scheduled an apt with Bradley Ferris, [facility]. Last ETOH use was one week ago.   Son has started back to school.    Past Psychiatric Medication Trials: Cymbalta Zoloft  Prozac- Not effective Buspar Seroquel- Reports that she felt over medicated on higher doses of Seroquel (possibly 200 mg) Xanax Phentermine- increased anxiety, tension Topamax  Review of Systems:  Review of Systems  Constitutional: Positive for diaphoresis.  Musculoskeletal: Negative for gait problem.  Neurological: Negative for tremors.       Has had one migraine  Psychiatric/Behavioral:       Please refer to HPI    Medications: I have reviewed the patient's current medications.  Current Outpatient Medications  Medication Sig Dispense Refill  . [START ON 07/28/2020] ALPRAZolam (XANAX) 0.5 MG tablet TAKE ONE TABLET BY MOUTH TWICE A DAY AS NEEDED FOR ANXIETY 45 tablet 2  . DULoxetine (CYMBALTA) 60 MG capsule Take 1 capsule (60 mg total) by mouth  daily. 90 capsule 0  . SUMAtriptan (IMITREX) 50 MG tablet Take 1 tablet (50 mg total) by mouth every 2 (two) hours as needed for migraine. May repeat in 2 hours if headache persists or recurs. 10 tablet 2  . propranolol (INDERAL) 10 MG tablet Take 1-2 tabs po BID prn anxiety 120 tablet 1  . QUEtiapine (SEROQUEL) 50 MG tablet Take 1-2 tabs po QHS 180 tablet 0  . topiramate (TOPAMAX) 50 MG tablet Take 1 tablet (50 mg total) by mouth at bedtime. 90 tablet 0   No current facility-administered medications for this visit.    Medication Side Effects: None  Allergies: No Known Allergies  Past Medical History:  Diagnosis Date  . Migraines     Family History  Problem Relation Age of Onset  . Arthritis Mother   . Alcohol abuse Mother     Social History   Socioeconomic History  . Marital status: Married    Spouse name: Not on file  . Number of children: Not on file  . Years of education: Not on file  . Highest education level: Not on file  Occupational History  . Not on file  Tobacco Use  . Smoking status: Never Smoker  . Smokeless tobacco: Never Used  Substance and Sexual Activity  . Alcohol use: Yes    Comment: Occ  . Drug use: No  . Sexual activity: Not on file  Other Topics Concern  . Not on file  Social History Narrative  .  Not on file   Social Determinants of Health   Financial Resource Strain:   . Difficulty of Paying Living Expenses: Not on file  Food Insecurity:   . Worried About Programme researcher, broadcasting/film/video in the Last Year: Not on file  . Ran Out of Food in the Last Year: Not on file  Transportation Needs:   . Lack of Transportation (Medical): Not on file  . Lack of Transportation (Non-Medical): Not on file  Physical Activity:   . Days of Exercise per Week: Not on file  . Minutes of Exercise per Session: Not on file  Stress:   . Feeling of Stress : Not on file  Social Connections:   . Frequency of Communication with Friends and Family: Not on file  . Frequency of  Social Gatherings with Friends and Family: Not on file  . Attends Religious Services: Not on file  . Active Member of Clubs or Organizations: Not on file  . Attends Banker Meetings: Not on file  . Marital Status: Not on file  Intimate Partner Violence:   . Fear of Current or Ex-Partner: Not on file  . Emotionally Abused: Not on file  . Physically Abused: Not on file  . Sexually Abused: Not on file    Past Medical History, Surgical history, Social history, and Family history were reviewed and updated as appropriate.   Please see review of systems for further details on the patient's review from today.   Objective:   Physical Exam:  BP (!) 151/100   Pulse 85   Physical Exam Constitutional:      General: She is not in acute distress. Musculoskeletal:        General: No deformity.  Neurological:     Mental Status: She is alert and oriented to person, place, and time.     Coordination: Coordination normal.  Psychiatric:        Attention and Perception: Attention and perception normal. She does not perceive auditory or visual hallucinations.        Mood and Affect: Mood is anxious. Mood is not depressed. Affect is not labile, blunt, angry or inappropriate.        Speech: Speech normal.        Behavior: Behavior normal.        Thought Content: Thought content normal. Thought content is not paranoid or delusional. Thought content does not include homicidal or suicidal ideation. Thought content does not include homicidal or suicidal plan.        Cognition and Memory: Cognition and memory normal.        Judgment: Judgment normal.     Comments: Insight intact     Lab Review:     Component Value Date/Time   NA 144 02/18/2016 2200   K 3.8 02/18/2016 2200   CL 106 02/18/2016 2200   CO2 25 02/18/2016 2200   GLUCOSE 108 (H) 02/18/2016 2200   BUN 10 02/18/2016 2200   CREATININE 0.74 02/18/2016 2200   CALCIUM 9.0 02/18/2016 2200   PROT 8.0 02/18/2016 2200   ALBUMIN  4.8 02/18/2016 2200   AST 27 02/18/2016 2200   ALT 30 02/18/2016 2200   ALKPHOS 53 02/18/2016 2200   BILITOT 0.3 02/18/2016 2200   GFRNONAA >60 02/18/2016 2200   GFRAA >60 02/18/2016 2200       Component Value Date/Time   WBC 7.6 02/18/2016 2200   RBC 4.70 02/18/2016 2200   HGB 14.5 02/18/2016 2200   HCT 42.2 02/18/2016  2200   PLT 346 02/18/2016 2200   MCV 89.8 02/18/2016 2200   MCH 30.9 02/18/2016 2200   MCHC 34.4 02/18/2016 2200   RDW 13.0 02/18/2016 2200    No results found for: POCLITH, LITHIUM   No results found for: PHENYTOIN, PHENOBARB, VALPROATE, CBMZ   .res Assessment: Plan:   Discussed potential benefits, risks, and side effects of propanolol.  Discussed that this may be helpful for reducing her blood pressure and decreasing anxiety, particularly at work without causing somnolence or impaired concentration.  Will start propanolol 10 mg 1-2 tabs twice daily as needed for anxiety. Discussed trying to use Xanax only as needed for severe anxiety to minimize risk of dependence. Continue Cymbalta 60 mg daily for anxiety and depression. Continue Topamax 50 mg at bedtime for migraines, anxiety, and mood signs and symptoms. Continue Imitrex as needed for migraine. Agree with plan to start therapy with Bradley Ferris, [facility]. Patient to follow-up in 3 months or sooner if clinically indicated. Patient advised to contact office with any questions, adverse effects, or acute worsening in signs and symptoms.  Tracy Lambert was seen today for anxiety and follow-up.  Diagnoses and all orders for this visit:  Episodic mood disorder (HCC) -     QUEtiapine (SEROQUEL) 50 MG tablet; Take 1-2 tabs po QHS -     DULoxetine (CYMBALTA) 60 MG capsule; Take 1 capsule (60 mg total) by mouth daily.  Insomnia, unspecified type -     QUEtiapine (SEROQUEL) 50 MG tablet; Take 1-2 tabs po QHS  Anxiety state -     ALPRAZolam (XANAX) 0.5 MG tablet; TAKE ONE TABLET BY MOUTH TWICE A DAY AS NEEDED FOR  ANXIETY -     DULoxetine (CYMBALTA) 60 MG capsule; Take 1 capsule (60 mg total) by mouth daily. -     topiramate (TOPAMAX) 50 MG tablet; Take 1 tablet (50 mg total) by mouth at bedtime. -     propranolol (INDERAL) 10 MG tablet; Take 1-2 tabs po BID prn anxiety  Other migraine without status migrainosus, not intractable -     SUMAtriptan (IMITREX) 50 MG tablet; Take 1 tablet (50 mg total) by mouth every 2 (two) hours as needed for migraine. May repeat in 2 hours if headache persists or recurs. -     topiramate (TOPAMAX) 50 MG tablet; Take 1 tablet (50 mg total) by mouth at bedtime.     Please see After Visit Summary for patient specific instructions.  Future Appointments  Date Time Provider Department Center  10/07/2020  8:30 AM Corie Chiquito, PMHNP [facility] None    No orders of the defined types were placed in this encounter.   -------------------------------

## 2020-08-03 ENCOUNTER — Other Ambulatory Visit: Payer: Self-pay | Admitting: Psychiatry

## 2020-08-03 DIAGNOSIS — G4700 Insomnia, unspecified: Secondary | ICD-10-CM

## 2020-08-03 DIAGNOSIS — F39 Unspecified mood [affective] disorder: Secondary | ICD-10-CM

## 2020-08-03 NOTE — Telephone Encounter (Signed)
review 

## 2020-09-06 ENCOUNTER — Other Ambulatory Visit: Payer: Self-pay | Admitting: Psychiatry

## 2020-09-06 DIAGNOSIS — F39 Unspecified mood [affective] disorder: Secondary | ICD-10-CM

## 2020-09-06 DIAGNOSIS — F411 Generalized anxiety disorder: Secondary | ICD-10-CM

## 2020-09-06 DIAGNOSIS — G4700 Insomnia, unspecified: Secondary | ICD-10-CM

## 2020-09-16 ENCOUNTER — Other Ambulatory Visit: Payer: Self-pay | Admitting: Psychiatry

## 2020-09-16 DIAGNOSIS — F411 Generalized anxiety disorder: Secondary | ICD-10-CM

## 2020-09-17 NOTE — Telephone Encounter (Signed)
Next apt 11/24

## 2020-10-04 ENCOUNTER — Other Ambulatory Visit: Payer: Self-pay | Admitting: Psychiatry

## 2020-10-04 DIAGNOSIS — G4700 Insomnia, unspecified: Secondary | ICD-10-CM

## 2020-10-04 DIAGNOSIS — F39 Unspecified mood [affective] disorder: Secondary | ICD-10-CM

## 2020-10-07 ENCOUNTER — Telehealth (INDEPENDENT_AMBULATORY_CARE_PROVIDER_SITE_OTHER): Payer: 59 | Admitting: Psychiatry

## 2020-10-07 ENCOUNTER — Encounter: Payer: Self-pay | Admitting: Psychiatry

## 2020-10-07 DIAGNOSIS — F39 Unspecified mood [affective] disorder: Secondary | ICD-10-CM | POA: Diagnosis not present

## 2020-10-07 DIAGNOSIS — G4700 Insomnia, unspecified: Secondary | ICD-10-CM | POA: Diagnosis not present

## 2020-10-07 DIAGNOSIS — F411 Generalized anxiety disorder: Secondary | ICD-10-CM

## 2020-10-07 MED ORDER — ALPRAZOLAM 0.5 MG PO TABS: ORAL_TABLET | ORAL | 2 refills | Status: DC

## 2020-10-07 MED ORDER — QUETIAPINE FUMARATE 100 MG PO TABS: 100.0000 mg | ORAL_TABLET | Freq: Every day | ORAL | 0 refills | Status: DC

## 2020-10-07 MED ORDER — PROPRANOLOL HCL 10 MG PO TABS: ORAL_TABLET | ORAL | 1 refills | Status: DC

## 2020-10-07 MED ORDER — DULOXETINE HCL 60 MG PO CPEP: 60.0000 mg | ORAL_CAPSULE | Freq: Every day | ORAL | 1 refills | Status: DC

## 2020-10-07 NOTE — Progress Notes (Signed)
Tracy Lambert [id number] [birth year] 44 y.o.  Virtual Visit via Video Note  I connected with pt @ on 10/07/20 at  8:30 AM EST by a video enabled telemedicine application and verified that I am speaking with the correct person using two identifiers.   I discussed the limitations of evaluation and management by telemedicine and the availability of in person appointments. The patient expressed understanding and agreed to proceed.  I discussed the assessment and treatment plan with the patient. The patient was provided an opportunity to ask questions and all were answered. The patient agreed with the plan and demonstrated an understanding of the instructions.   The patient was advised to call back or seek an in-person evaluation if the symptoms worsen or if the condition fails to improve as anticipated.  I provided 20 minutes of non-face-to-face time during this encounter.  The patient was located at home.  The provider was located at [facility].   Tracy Lambert, PMHNP   Subjective:   Patient ID:  Tracy Lambert is a 44 y.o. (DOB May 18, 1976) female.  Chief Complaint:  Chief Complaint  Patient presents with  . Follow-up    Mood disturbance, anxiety, insomnia    HPI Tracy Lambert presents for follow-up of anxiety, insomnia, and mood disturbance. "I've been doing really well." She reports that she has been seeing Tracy Lambert, [facility] and reports "it has been life changing." She reports that her mood has been "good." Has had some irritability that coincided with menstrual cycle. She reports that she has been less anxious. No longer experiencing any increased BP with anxiety. Denies any panic attacks. Sleeping very well. Going to bed around 9-10 pm. Appetite has been good. Has been craving sweets. Denies over-eating and reports that she allows herself some food "treats." She reports that her energy and motivation have been good. Work has been going well. She reports feeling "more clear  headed." Improved concentration. Denies SI.   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 Has been attending AA meetings regularly.   Has not been taking Topamax. Reports that Propranolol has been helpful. Takes Seroquel around 9 pm.   Past Psychiatric Medication Trials: Cymbalta Zoloft  Prozac- Not effective Buspar Seroquel- Reports that she felt over medicated on higher doses of Seroquel (possibly 200 mg) Xanax Phentermine- increased anxiety, tension Topamax  Review of Systems:  Review of Systems  Cardiovascular: Negative for palpitations.  Musculoskeletal: Negative for gait problem.  Neurological: Negative for tremors.  Psychiatric/Behavioral:       Please refer to HPI    Medications: I have reviewed the patient's current medications.  Current Outpatient Medications  Medication Sig Dispense Refill  . [START ON 10/16/2020] ALPRAZolam (XANAX) 0.5 MG tablet TAKE ONE TABLET BY MOUTH TWICE A DAY AS NEEDED FOR ANXIETY 45 tablet 2  . b complex vitamins capsule Take 1 capsule by mouth daily.    . DULoxetine (CYMBALTA) 60 MG capsule Take 1 capsule (60 mg total) by mouth daily. 90 capsule 1  . Multiple Vitamin (MULTIVITAMIN) tablet Take 1 tablet by mouth daily.    . propranolol (INDERAL) 10 MG tablet TAKE ONE TO TWO TABLETS BY MOUTH TWICE A DAY AS NEEDED FOR ANXIETY 120 tablet 1  . QUEtiapine (SEROQUEL) 100 MG tablet Take 1 tablet (100 mg total) by mouth at bedtime. 30 tablet 0  . SUMAtriptan (IMITREX) 50 MG tablet Take 1 tablet (50 mg total) by mouth every 2 (two) hours as needed for migraine. May repeat in 2 hours if headache persists  or recurs. 10 tablet 2   No current facility-administered medications for this visit.    Medication Side Effects: None Denies involuntary movements  Allergies: No Known Allergies  Past Medical History:  Diagnosis Date  . Migraines     Family History  Problem Relation Age of Onset  . Arthritis Mother   . Alcohol abuse Mother     Social History    Socioeconomic History  . Marital status: Married    Spouse name: Not on file  . Number of children: Not on file  . Years of education: Not on file  . Highest education level: Not on file  Occupational History  . Not on file  Tobacco Use  . Smoking status: Never Smoker  . Smokeless tobacco: Never Used  Substance and Sexual Activity  . Alcohol use: Yes    Comment: Occ  . Drug use: No  . Sexual activity: Not on file  Other Topics Concern  . Not on file  Social History Narrative  . Not on file   Social Determinants of Health   Financial Resource Strain:   . Difficulty of Paying Living Expenses: Not on file  Food Insecurity:   . Worried About Programme researcher, broadcasting/film/video in the Last Year: Not on file  . Ran Out of Food in the Last Year: Not on file  Transportation Needs:   . Lack of Transportation (Medical): Not on file  . Lack of Transportation (Non-Medical): Not on file  Physical Activity:   . Days of Exercise per Week: Not on file  . Minutes of Exercise per Session: Not on file  Stress:   . Feeling of Stress : Not on file  Social Connections:   . Frequency of Communication with Friends and Family: Not on file  . Frequency of Social Gatherings with Friends and Family: Not on file  . Attends Religious Services: Not on file  . Active Member of Clubs or Organizations: Not on file  . Attends Banker Meetings: Not on file  . Marital Status: Not on file  Intimate Partner Violence:   . Fear of Current or Ex-Partner: Not on file  . Emotionally Abused: Not on file  . Physically Abused: Not on file  . Sexually Abused: Not on file    Past Medical History, Surgical history, Social history, and Family history were reviewed and updated as appropriate.   Please see review of systems for further details on the patient's review from today.   Objective:   Physical Exam:  Pulse 85   Physical Exam Neurological:     Mental Status: She is alert and oriented to person,  place, and time.     Cranial Nerves: No dysarthria.  Psychiatric:        Attention and Perception: Attention and perception normal.        Mood and Affect: Mood normal.        Speech: Speech normal.        Behavior: Behavior is cooperative.        Thought Content: Thought content normal. Thought content is not paranoid or delusional. Thought content does not include homicidal or suicidal ideation. Thought content does not include homicidal or suicidal plan.        Cognition and Memory: Cognition and memory normal.        Judgment: Judgment normal.     Comments: Insight intact     Lab Review:     Component Value Date/Time   NA 144 02/18/2016  2200   K 3.8 02/18/2016 2200   CL 106 02/18/2016 2200   CO2 25 02/18/2016 2200   GLUCOSE 108 (H) 02/18/2016 2200   BUN 10 02/18/2016 2200   CREATININE 0.74 02/18/2016 2200   CALCIUM 9.0 02/18/2016 2200   PROT 8.0 02/18/2016 2200   ALBUMIN 4.8 02/18/2016 2200   AST 27 02/18/2016 2200   ALT 30 02/18/2016 2200   ALKPHOS 53 02/18/2016 2200   BILITOT 0.3 02/18/2016 2200   GFRNONAA >60 02/18/2016 2200   GFRAA >60 02/18/2016 2200       Component Value Date/Time   WBC 7.6 02/18/2016 2200   RBC 4.70 02/18/2016 2200   HGB 14.5 02/18/2016 2200   HCT 42.2 02/18/2016 2200   PLT 346 02/18/2016 2200   MCV 89.8 02/18/2016 2200   MCH 30.9 02/18/2016 2200   MCHC 34.4 02/18/2016 2200   RDW 13.0 02/18/2016 2200    No results found for: POCLITH, LITHIUM   No results found for: PHENYTOIN, PHENOBARB, VALPROATE, CBMZ   .res Assessment: Plan:   Will continue current plan of care since target signs and symptoms are well controlled without any tolerability issues. Continue Cymbalta 60 mg po qd for mood and anxiety.  Continue Seroquel 100 mg po QHS for insomnia and mood.  Continue Propranolol 10 mg BID prn anxiety.  Continue Xanax 0.5 mg po BID prn anxiety.  Recommend continuing therapy with Bradley Ferris, [facility]. Pt to follow-up in 3 months or sooner  if clinically indicated.  Patient advised to contact office with any questions, adverse effects, or acute worsening in signs and symptoms.  Ekta was seen today for follow-up.  Diagnoses and all orders for this visit:  Anxiety state -     ALPRAZolam (XANAX) 0.5 MG tablet; TAKE ONE TABLET BY MOUTH TWICE A DAY AS NEEDED FOR ANXIETY -     DULoxetine (CYMBALTA) 60 MG capsule; Take 1 capsule (60 mg total) by mouth daily. -     propranolol (INDERAL) 10 MG tablet; TAKE ONE TO TWO TABLETS BY MOUTH TWICE A DAY AS NEEDED FOR ANXIETY  Episodic mood disorder (HCC) -     DULoxetine (CYMBALTA) 60 MG capsule; Take 1 capsule (60 mg total) by mouth daily. -     QUEtiapine (SEROQUEL) 100 MG tablet; Take 1 tablet (100 mg total) by mouth at bedtime.  Insomnia, unspecified type -     QUEtiapine (SEROQUEL) 100 MG tablet; Take 1 tablet (100 mg total) by mouth at bedtime.     Please see After Visit Summary for patient specific instructions.  No future appointments.  No orders of the defined types were placed in this encounter.     -------------------------------

## 2020-11-04 ENCOUNTER — Other Ambulatory Visit: Payer: Self-pay | Admitting: Psychiatry

## 2020-11-04 DIAGNOSIS — G4700 Insomnia, unspecified: Secondary | ICD-10-CM

## 2020-11-04 DIAGNOSIS — F39 Unspecified mood [affective] disorder: Secondary | ICD-10-CM

## 2020-12-01 ENCOUNTER — Other Ambulatory Visit: Payer: Self-pay | Admitting: Psychiatry

## 2020-12-01 DIAGNOSIS — G4700 Insomnia, unspecified: Secondary | ICD-10-CM

## 2020-12-01 DIAGNOSIS — F39 Unspecified mood [affective] disorder: Secondary | ICD-10-CM

## 2020-12-08 ENCOUNTER — Other Ambulatory Visit: Payer: Self-pay | Admitting: Psychiatry

## 2020-12-08 DIAGNOSIS — F411 Generalized anxiety disorder: Secondary | ICD-10-CM

## 2020-12-30 ENCOUNTER — Other Ambulatory Visit: Payer: Self-pay | Admitting: Psychiatry

## 2020-12-30 DIAGNOSIS — F39 Unspecified mood [affective] disorder: Secondary | ICD-10-CM

## 2020-12-30 DIAGNOSIS — G4700 Insomnia, unspecified: Secondary | ICD-10-CM

## 2021-01-15 ENCOUNTER — Other Ambulatory Visit: Payer: Self-pay | Admitting: Psychiatry

## 2021-01-15 DIAGNOSIS — F411 Generalized anxiety disorder: Secondary | ICD-10-CM

## 2021-01-28 ENCOUNTER — Other Ambulatory Visit: Payer: Self-pay | Admitting: Psychiatry

## 2021-01-28 DIAGNOSIS — F411 Generalized anxiety disorder: Secondary | ICD-10-CM

## 2021-01-28 DIAGNOSIS — G4700 Insomnia, unspecified: Secondary | ICD-10-CM

## 2021-01-28 DIAGNOSIS — F39 Unspecified mood [affective] disorder: Secondary | ICD-10-CM

## 2021-02-02 ENCOUNTER — Other Ambulatory Visit: Payer: Self-pay | Admitting: Psychiatry

## 2021-02-02 DIAGNOSIS — F411 Generalized anxiety disorder: Secondary | ICD-10-CM

## 2021-02-02 NOTE — Telephone Encounter (Signed)
Controlled substance 

## 2021-02-06 ENCOUNTER — Other Ambulatory Visit: Payer: Self-pay | Admitting: Psychiatry

## 2021-02-06 DIAGNOSIS — G43809 Other migraine, not intractable, without status migrainosus: Secondary | ICD-10-CM

## 2021-02-08 NOTE — Telephone Encounter (Signed)
Quantity #3?

## 2021-02-20 ENCOUNTER — Other Ambulatory Visit: Payer: Self-pay | Admitting: Psychiatry

## 2021-02-20 DIAGNOSIS — G4700 Insomnia, unspecified: Secondary | ICD-10-CM

## 2021-02-20 DIAGNOSIS — F39 Unspecified mood [affective] disorder: Secondary | ICD-10-CM

## 2021-02-24 ENCOUNTER — Other Ambulatory Visit: Payer: Self-pay | Admitting: Psychiatry

## 2021-02-24 DIAGNOSIS — F39 Unspecified mood [affective] disorder: Secondary | ICD-10-CM

## 2021-02-24 DIAGNOSIS — G4700 Insomnia, unspecified: Secondary | ICD-10-CM

## 2021-02-26 ENCOUNTER — Other Ambulatory Visit: Payer: Self-pay | Admitting: Psychiatry

## 2021-02-26 DIAGNOSIS — F411 Generalized anxiety disorder: Secondary | ICD-10-CM

## 2021-03-04 ENCOUNTER — Other Ambulatory Visit: Payer: Self-pay

## 2021-03-04 ENCOUNTER — Ambulatory Visit (INDEPENDENT_AMBULATORY_CARE_PROVIDER_SITE_OTHER): Payer: 59 | Admitting: Psychiatry

## 2021-03-04 ENCOUNTER — Encounter: Payer: Self-pay | Admitting: Psychiatry

## 2021-03-04 DIAGNOSIS — F39 Unspecified mood [affective] disorder: Secondary | ICD-10-CM

## 2021-03-04 DIAGNOSIS — G43809 Other migraine, not intractable, without status migrainosus: Secondary | ICD-10-CM | POA: Diagnosis not present

## 2021-03-04 DIAGNOSIS — G4700 Insomnia, unspecified: Secondary | ICD-10-CM | POA: Diagnosis not present

## 2021-03-04 DIAGNOSIS — F411 Generalized anxiety disorder: Secondary | ICD-10-CM

## 2021-03-04 MED ORDER — DULOXETINE HCL 60 MG PO CPEP: 120.0000 mg | ORAL_CAPSULE | Freq: Every day | ORAL | 1 refills | Status: DC

## 2021-03-04 MED ORDER — SUMATRIPTAN SUCCINATE 50 MG PO TABS: ORAL_TABLET | ORAL | 5 refills | Status: DC

## 2021-03-04 MED ORDER — QUETIAPINE FUMARATE 100 MG PO TABS: 100.0000 mg | ORAL_TABLET | Freq: Every day | ORAL | 1 refills | Status: DC

## 2021-03-04 MED ORDER — ALPRAZOLAM 0.5 MG PO TABS: ORAL_TABLET | ORAL | 5 refills | Status: DC

## 2021-03-04 NOTE — Progress Notes (Signed)
Tracy Lambert [id number] July 29, 1976 45 y.o.  Subjective:   Patient ID:  Tracy Lambert is a 45 y.o. (DOB 05-23-1976) female.  Chief Complaint:  Chief Complaint  Patient presents with  . Follow-up    Anxiety, depression, and insomnia    HPI Tracy Lambert presents to the office today for follow-up of depression, anxiety, and insomnia. She reports that she has been doing well. Has had some situational stress and sadness in response to her father went into a memory care unit on 02/12/21. Mood has been ok in general. She reports some baseline irritability. She reports that she notices some worsening mood s/s prior to start of menses. She reports that she increased Cymbalta to 120 mg on her own and feels this was helpful and "took the edge off."  She denies any side effects at 120 mg dose. She had some panic right after relapse. Denies excessive worry except for after relapse. Sleeping well and seroquel has been helpful. Appetite has been good. Energy and motivation have been good. Going to the gym 3 days a week. Concentration has been good. Denies SI.   She reports that she is using Xanax prn daily.   Has been seeing Bradley Ferris, LCAS. 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 She is now active in [place].   Son is doing well.   Past Psychiatric Medication Trials: Cymbalta Zoloft  Prozac- Not effective Buspar Seroquel- Reports that she felt over medicated on higher doses of Seroquel (possibly 200 mg) Xanax Phentermine- increased anxiety, tension Topamax   Review of Systems:  Review of Systems  Musculoskeletal: Negative for arthralgias and gait problem.  Neurological:       She reports Imitrex has been helpful for migraines. Headaches now occur about 1-2 times a month  Psychiatric/Behavioral:       Please refer to HPI    Medications: I have reviewed the patient's current medications.  Current Outpatient Medications  Medication Sig  Dispense Refill  . b complex vitamins capsule Take 1 capsule by mouth daily.    . Multiple Vitamin (MULTIVITAMIN) tablet Take 1 tablet by mouth daily.    . propranolol (INDERAL) 10 MG tablet TAKE 1 TABLET TO TWO TABLETS BY MOUTH TWO TIMES A DAY AS NEEDED FOR ANXIETY 120 tablet 1  . [START ON 03/29/2021] ALPRAZolam (XANAX) 0.5 MG tablet TAKE ONE TABLET BY MOUTH TWICE A DAY AS NEEDED FOR ANXIETY 45 tablet 5  . DULoxetine (CYMBALTA) 60 MG capsule Take 2 capsules (120 mg total) by mouth daily. 180 capsule 1  . QUEtiapine (SEROQUEL) 100 MG tablet Take 1 tablet (100 mg total) by mouth at bedtime. 90 tablet 1  . SUMAtriptan (IMITREX) 50 MG tablet TAKE ONE TABLET BY MOUTH AT ONSET OF HEADACHE; MAY REPEAT ONE TABLET IN 2 HOURS IF NEEDED. 10 tablet 5   No current facility-administered medications for this visit.    Medication Side Effects: None  Allergies: No Known Allergies  Past Medical History:  Diagnosis Date  . Migraines     Past Medical History, Surgical history, Social history, and Family history were reviewed and updated as appropriate.   Please see review of systems for further details on the patient's review from today.   Objective:   Physical Exam:  There were no vitals taken for this visit.  Physical Exam Constitutional:      General: She is not in acute distress. Musculoskeletal:  General: No deformity.  Neurological:     Mental Status: She is alert and oriented to person, place, and time.     Coordination: Coordination normal.  Psychiatric:        Attention and Perception: Attention and perception normal. She does not perceive auditory or visual hallucinations.        Mood and Affect: Mood normal. Mood is not anxious or depressed. Affect is not labile, blunt, angry or inappropriate.        Speech: Speech normal.        Behavior: Behavior normal.        Thought Content: Thought content normal. Thought content is not paranoid or delusional. Thought content does not  include homicidal or suicidal ideation. Thought content does not include homicidal or suicidal plan.        Cognition and Memory: Cognition and memory normal.        Judgment: Judgment normal.     Comments: Insight intact     Lab Review:     Component Value Date/Time   NA 144 02/18/2016 2200   K 3.8 02/18/2016 2200   CL 106 02/18/2016 2200   CO2 25 02/18/2016 2200   GLUCOSE 108 (H) 02/18/2016 2200   BUN 10 02/18/2016 2200   CREATININE 0.74 02/18/2016 2200   CALCIUM 9.0 02/18/2016 2200   PROT 8.0 02/18/2016 2200   ALBUMIN 4.8 02/18/2016 2200   AST 27 02/18/2016 2200   ALT 30 02/18/2016 2200   ALKPHOS 53 02/18/2016 2200   BILITOT 0.3 02/18/2016 2200   GFRNONAA >60 02/18/2016 2200   GFRAA >60 02/18/2016 2200       Component Value Date/Time   WBC 7.6 02/18/2016 2200   RBC 4.70 02/18/2016 2200   HGB 14.5 02/18/2016 2200   HCT 42.2 02/18/2016 2200   PLT 346 02/18/2016 2200   MCV 89.8 02/18/2016 2200   MCH 30.9 02/18/2016 2200   MCHC 34.4 02/18/2016 2200   RDW 13.0 02/18/2016 2200    No results found for: POCLITH, LITHIUM   No results found for: PHENYTOIN, PHENOBARB, VALPROATE, CBMZ   .res Assessment: Plan:   Will continue current plan of care since target signs and symptoms are well controlled without any tolerability issues. Recommend continuing therapy with Bradley Ferris, LCAS.  Pt to follow-up in 6 months or sooner if clinically indicated.  Patient advised to contact office with any questions, adverse effects, or acute worsening in signs and symptoms.  [name] was seen today for follow-up.  Diagnoses and all orders for this visit:  Episodic mood disorder (HCC) -     QUEtiapine (SEROQUEL) 100 MG tablet; Take 1 tablet (100 mg total) by mouth at bedtime. -     DULoxetine (CYMBALTA) 60 MG capsule; Take 2 capsules (120 mg total) by mouth daily.  Insomnia, unspecified type -     QUEtiapine (SEROQUEL) 100 MG tablet; Take 1 tablet (100 mg total) by mouth at  bedtime.  Anxiety state -     DULoxetine (CYMBALTA) 60 MG capsule; Take 2 capsules (120 mg total) by mouth daily. -     ALPRAZolam (XANAX) 0.5 MG tablet; TAKE ONE TABLET BY MOUTH TWICE A DAY AS NEEDED FOR ANXIETY  Other migraine without status migrainosus, not intractable -     SUMAtriptan (IMITREX) 50 MG tablet; TAKE ONE TABLET BY MOUTH AT ONSET OF HEADACHE; MAY REPEAT ONE TABLET IN 2 HOURS IF NEEDED.     Please see After Visit Summary for patient specific instructions.  Future  Appointments  Date Time Provider Department Center  09/01/2021  8:30 AM Corie Chiquito, PMHNP [facility] None    No orders of the defined types were placed in this encounter.   -------------------------------

## 2021-05-18 ENCOUNTER — Other Ambulatory Visit: Payer: Self-pay | Admitting: Psychiatry

## 2021-05-18 DIAGNOSIS — F411 Generalized anxiety disorder: Secondary | ICD-10-CM

## 2021-05-20 NOTE — Telephone Encounter (Signed)
PT states she has requested refill from pharmacy on propranolol. She leaves to go out of town tomorrow. Can we please send in her refills?

## 2021-07-17 ENCOUNTER — Other Ambulatory Visit: Payer: Self-pay | Admitting: Psychiatry

## 2021-07-17 DIAGNOSIS — F411 Generalized anxiety disorder: Secondary | ICD-10-CM

## 2021-07-20 NOTE — Telephone Encounter (Signed)
Pt had to transfer to this pharmacy because the other pharmacy was out. She lost all of her refills. Please refill here

## 2021-07-20 NOTE — Telephone Encounter (Signed)
Please submit.

## 2021-08-27 ENCOUNTER — Other Ambulatory Visit: Payer: Self-pay | Admitting: Psychiatry

## 2021-08-27 DIAGNOSIS — F39 Unspecified mood [affective] disorder: Secondary | ICD-10-CM

## 2021-08-27 DIAGNOSIS — G4700 Insomnia, unspecified: Secondary | ICD-10-CM

## 2021-09-01 ENCOUNTER — Ambulatory Visit (INDEPENDENT_AMBULATORY_CARE_PROVIDER_SITE_OTHER): Payer: 59 | Admitting: Psychiatry

## 2021-09-01 ENCOUNTER — Other Ambulatory Visit: Payer: Self-pay

## 2021-09-01 ENCOUNTER — Encounter: Payer: Self-pay | Admitting: Psychiatry

## 2021-09-01 DIAGNOSIS — F411 Generalized anxiety disorder: Secondary | ICD-10-CM

## 2021-09-01 DIAGNOSIS — G4700 Insomnia, unspecified: Secondary | ICD-10-CM | POA: Diagnosis not present

## 2021-09-01 DIAGNOSIS — G43809 Other migraine, not intractable, without status migrainosus: Secondary | ICD-10-CM

## 2021-09-01 DIAGNOSIS — F39 Unspecified mood [affective] disorder: Secondary | ICD-10-CM

## 2021-09-01 MED ORDER — PROPRANOLOL HCL 10 MG PO TABS: ORAL_TABLET | ORAL | 1 refills | Status: DC

## 2021-09-01 MED ORDER — QUETIAPINE FUMARATE 50 MG PO TABS: ORAL_TABLET | ORAL | 0 refills | Status: DC

## 2021-09-01 MED ORDER — ALPRAZOLAM 0.5 MG PO TABS: ORAL_TABLET | ORAL | 1 refills | Status: DC

## 2021-09-01 MED ORDER — FLUOXETINE HCL 20 MG PO CAPS: 20.0000 mg | ORAL_CAPSULE | Freq: Every day | ORAL | 1 refills | Status: DC

## 2021-09-01 MED ORDER — DULOXETINE HCL 30 MG PO CPEP: 30.0000 mg | ORAL_CAPSULE | Freq: Every day | ORAL | 0 refills | Status: DC

## 2021-09-01 MED ORDER — SUMATRIPTAN SUCCINATE 50 MG PO TABS: ORAL_TABLET | ORAL | 5 refills | Status: DC

## 2021-09-01 NOTE — Progress Notes (Signed)
Tracy Lambert [id number] Nov 22, 1975 45 y.o.  Subjective:   Patient ID:  Tracy Lambert is a 45 y.o. (DOB 1976-03-20) female.  Chief Complaint:  Chief Complaint  Patient presents with   Follow-up    PMDD, anxiety, depression, h/o ETOH misuse    HPI Tracy Lambert presents to the office today for follow-up of PMDD, anxiety, depression, and h/o ETOH misuse. She reports that anxiety has been ok, "especially when I am not drinking." Denies panic. Denies depressed mood. Sleeping well with Seroquel. Appetite has been good. Working on eating throughout the day. Energy and motivation are ok. Concentration is ok. Denies SI.  She reports that she decreased Cymbalta to 60 mg 3-4 months ago and did not experience any significant changes in mood or anxiety.   Has been seeing Bradley Ferris, [facility]. She continues to work on sobriety. Has had brief relapses, that tend to occur right before onset of menses. Has not been able to tolerate OCP's in the past.  AIMS    Flowsheet Row Office Visit from 09/01/2021 in [facility]  AIMS Total Score 0       Work has been stressful.   Reports taking Xanax prn about twice a day.  Past Psychiatric Medication Trials: Cymbalta Zoloft  Prozac- Not effective Buspar Seroquel- Reports that she felt over medicated on higher doses of Seroquel (possibly 200 mg) Xanax Phentermine- increased anxiety, tension Topamax  Review of Systems:  Review of Systems  Musculoskeletal:  Positive for back pain. Negative for gait problem.  Neurological:  Negative for tremors.  Psychiatric/Behavioral:         Please refer to HPI   Medications: I have reviewed the patient's current medications.  Current Outpatient Medications  Medication Sig Dispense Refill   b complex vitamins capsule Take 1 capsule by mouth daily.     FLUoxetine (PROZAC) 20 MG capsule Take 1 capsule (20 mg total) by mouth daily. 30 capsule 1   Multiple Vitamin (MULTIVITAMIN) tablet Take 1 tablet by  mouth daily.     Pyridoxine HCl (VITAMIN B-6 PO) Take by mouth.     ALPRAZolam (XANAX) 0.5 MG tablet TAKE ONE TABLET BY MOUTH TWICE A DAY AS NEEDED FOR ANXIETY 60 tablet 1   DULoxetine (CYMBALTA) 30 MG capsule Take 1 capsule (30 mg total) by mouth daily for 7 days. Then stop 7 capsule 0   propranolol (INDERAL) 10 MG tablet TAKE 1 TO 2 TABLETS BY MOUTH TWO TIMES A DAY AS NEEDED FOR ANXIETY 120 tablet 1   QUEtiapine (SEROQUEL) 50 MG tablet Take 1-2 tablets at bedtime 180 tablet 0   SUMAtriptan (IMITREX) 50 MG tablet TAKE ONE TABLET BY MOUTH AT ONSET OF HEADACHE; MAY REPEAT ONE TABLET IN 2 HOURS IF NEEDED. 10 tablet 5   No current facility-administered medications for this visit.    Medication Side Effects: None  Allergies: No Known Allergies  Past Medical History:  Diagnosis Date   Migraines     Past Medical History, Surgical history, Social history, and Family history were reviewed and updated as appropriate.   Please see review of systems for further details on the patient's review from today.   Objective:   Physical Exam:  There were no vitals taken for this visit.  Physical Exam Constitutional:      General: She is not in acute distress. Musculoskeletal:        General: No deformity.  Neurological:     Mental Status: She is alert and oriented to person, place, and  time.     Coordination: Coordination normal.  Psychiatric:        Attention and Perception: Attention and perception normal. She does not perceive auditory or visual hallucinations.        Mood and Affect: Mood normal. Mood is not anxious or depressed. Affect is not labile, blunt, angry or inappropriate.        Speech: Speech normal.        Behavior: Behavior normal.        Thought Content: Thought content normal. Thought content is not paranoid or delusional. Thought content does not include homicidal or suicidal ideation. Thought content does not include homicidal or suicidal plan.        Cognition and Memory:  Cognition and memory normal.        Judgment: Judgment normal.     Comments: Insight intact    Lab Review:     Component Value Date/Time   NA 144 02/18/2016 2200   K 3.8 02/18/2016 2200   CL 106 02/18/2016 2200   CO2 25 02/18/2016 2200   GLUCOSE 108 (H) 02/18/2016 2200   BUN 10 02/18/2016 2200   CREATININE 0.74 02/18/2016 2200   CALCIUM 9.0 02/18/2016 2200   PROT 8.0 02/18/2016 2200   ALBUMIN 4.8 02/18/2016 2200   AST 27 02/18/2016 2200   ALT 30 02/18/2016 2200   ALKPHOS 53 02/18/2016 2200   BILITOT 0.3 02/18/2016 2200   GFRNONAA >60 02/18/2016 2200   GFRAA >60 02/18/2016 2200       Component Value Date/Time   WBC 7.6 02/18/2016 2200   RBC 4.70 02/18/2016 2200   HGB 14.5 02/18/2016 2200   HCT 42.2 02/18/2016 2200   PLT 346 02/18/2016 2200   MCV 89.8 02/18/2016 2200   MCH 30.9 02/18/2016 2200   MCHC 34.4 02/18/2016 2200   RDW 13.0 02/18/2016 2200    No results found for: POCLITH, LITHIUM   No results found for: PHENYTOIN, PHENOBARB, VALPROATE, CBMZ   .res Assessment: Plan:    Patient seen for 30 minutes and time spent discussing treatment options for PMDD to include Prozac and lamotrigine.  Reviewed potential benefits, risks, and side effects of both possible treatment options.  Patient is amenable to transition to Prozac.  Discussed cross titration from duloxetine to Prozac to minimize risk of discontinuation signs and symptoms and side effects.  Will decrease duloxetine to 30 mg daily for 1 week, then stop. Will start Prozac 20 mg daily for PMDD, anxiety, and depression. Continue Xanax 0.5 mg twice daily for anxiety.  Will change prescription for quantity #60 and discussed that this should last an entire month. Continue Seroquel 50 mg 1 to 2 tablets at bedtime for insomnia. Will continue sumatriptan and as needed for migraines. Recommend continuing psychotherapy with Bradley Ferris, LCAS. Patient advised to contact office with any questions, adverse effects, or acute  worsening in signs and symptoms.   [name] was seen today for follow-up.  Diagnoses and all orders for this visit:  Insomnia, unspecified type -     QUEtiapine (SEROQUEL) 50 MG tablet; Take 1-2 tablets at bedtime  Episodic mood disorder (HCC) -     DULoxetine (CYMBALTA) 30 MG capsule; Take 1 capsule (30 mg total) by mouth daily for 7 days. Then stop -     FLUoxetine (PROZAC) 20 MG capsule; Take 1 capsule (20 mg total) by mouth daily.  Anxiety state -     DULoxetine (CYMBALTA) 30 MG capsule; Take 1 capsule (30 mg total) by  mouth daily for 7 days. Then stop -     FLUoxetine (PROZAC) 20 MG capsule; Take 1 capsule (20 mg total) by mouth daily. -     Discontinue: ALPRAZolam (XANAX) 0.5 MG tablet; TAKE ONE TABLET BY MOUTH TWICE A DAY AS NEEDED FOR ANXIETY -     propranolol (INDERAL) 10 MG tablet; TAKE 1 TO 2 TABLETS BY MOUTH TWO TIMES A DAY AS NEEDED FOR ANXIETY -     ALPRAZolam (XANAX) 0.5 MG tablet; TAKE ONE TABLET BY MOUTH TWICE A DAY AS NEEDED FOR ANXIETY  Other migraine without status migrainosus, not intractable -     SUMAtriptan (IMITREX) 50 MG tablet; TAKE ONE TABLET BY MOUTH AT ONSET OF HEADACHE; MAY REPEAT ONE TABLET IN 2 HOURS IF NEEDED.    Please see After Visit Summary for patient specific instructions.  Future Appointments  Date Time Provider Department Center  03/02/2022 12:45 PM Corie Chiquito, PMHNP [facility] None    No orders of the defined types were placed in this encounter.   -------------------------------

## 2021-09-05 ENCOUNTER — Other Ambulatory Visit: Payer: Self-pay | Admitting: Psychiatry

## 2021-09-05 DIAGNOSIS — F39 Unspecified mood [affective] disorder: Secondary | ICD-10-CM

## 2021-09-05 DIAGNOSIS — F411 Generalized anxiety disorder: Secondary | ICD-10-CM

## 2021-10-28 ENCOUNTER — Other Ambulatory Visit: Payer: Self-pay | Admitting: Psychiatry

## 2021-10-28 DIAGNOSIS — F39 Unspecified mood [affective] disorder: Secondary | ICD-10-CM

## 2021-10-28 DIAGNOSIS — F411 Generalized anxiety disorder: Secondary | ICD-10-CM

## 2021-11-17 ENCOUNTER — Telehealth: Payer: Self-pay | Admitting: Psychiatry

## 2021-11-17 DIAGNOSIS — G4700 Insomnia, unspecified: Secondary | ICD-10-CM

## 2021-11-17 MED ORDER — QUETIAPINE FUMARATE 50 MG PO TABS: ORAL_TABLET | ORAL | 0 refills | Status: DC

## 2021-11-17 NOTE — Telephone Encounter (Signed)
Pt called reporting her pharmacy has changed to [facility] [address]  phone: [phone]  fax: [phone]. [employer] stopped contract with [place]. Please delete all HT from chart. Requesting new Rx for  Quetiapine 50 mg  1-2 @ bedtime. No refills so one med that did not transfer.Pt out completely.Pt # [phone]. Apt 4/19

## 2021-12-13 ENCOUNTER — Other Ambulatory Visit: Payer: Self-pay | Admitting: Psychiatry

## 2021-12-13 DIAGNOSIS — F411 Generalized anxiety disorder: Secondary | ICD-10-CM

## 2021-12-13 DIAGNOSIS — G4700 Insomnia, unspecified: Secondary | ICD-10-CM

## 2021-12-13 NOTE — Telephone Encounter (Signed)
Pt called requesting new Rx for Xanax. Pt stated fill date 1/31. [facility] [address] [place]. Apt 4/19.

## 2021-12-15 NOTE — Telephone Encounter (Signed)
Pended. Patient notified.

## 2021-12-15 NOTE — Telephone Encounter (Signed)
Patients mailbox is full

## 2021-12-15 NOTE — Telephone Encounter (Signed)
Tracy Lambert just called to check on status of refill of her Xanax.  Others were requested by the pharmacy as well, but she is going to be out of the Xanax and needs it refilled asap.  Appt 03/02/22. [facility] [address]

## 2022-01-07 ENCOUNTER — Other Ambulatory Visit: Payer: Self-pay | Admitting: Psychiatry

## 2022-01-07 DIAGNOSIS — F411 Generalized anxiety disorder: Secondary | ICD-10-CM

## 2022-01-10 ENCOUNTER — Other Ambulatory Visit: Payer: Self-pay | Admitting: Psychiatry

## 2022-01-10 DIAGNOSIS — F411 Generalized anxiety disorder: Secondary | ICD-10-CM

## 2022-01-13 NOTE — Telephone Encounter (Signed)
This was pended to [place] earlier today. Just waiting for her to sign off on it.  ?

## 2022-01-13 NOTE — Telephone Encounter (Signed)
Please send in her xanax. Pt has called 4 times checking the status of this medicine. Please send it in today. It is due today and she is out ?

## 2022-01-13 NOTE — Telephone Encounter (Signed)
Patient notified

## 2022-01-15 ENCOUNTER — Other Ambulatory Visit: Payer: Self-pay | Admitting: Psychiatry

## 2022-01-15 DIAGNOSIS — F39 Unspecified mood [affective] disorder: Secondary | ICD-10-CM

## 2022-01-15 DIAGNOSIS — F411 Generalized anxiety disorder: Secondary | ICD-10-CM

## 2022-02-02 ENCOUNTER — Other Ambulatory Visit: Payer: Self-pay | Admitting: Psychiatry

## 2022-02-02 DIAGNOSIS — F411 Generalized anxiety disorder: Secondary | ICD-10-CM

## 2022-02-09 ENCOUNTER — Other Ambulatory Visit: Payer: Self-pay | Admitting: Psychiatry

## 2022-02-09 DIAGNOSIS — F411 Generalized anxiety disorder: Secondary | ICD-10-CM

## 2022-02-10 NOTE — Telephone Encounter (Signed)
Tracy Lambert called this morning at 9:25am just to request that the refills for her medications get sent in today or no later than tomorrow.  She is leaving town in Sunday.  She can't get her Xanax until Sunday but the prescription needs to be there so she can pick up Sunday morning before she leaves.  Appt 03/02/22 ?

## 2022-02-11 NOTE — Telephone Encounter (Signed)
Pended.

## 2022-03-02 ENCOUNTER — Telehealth (INDEPENDENT_AMBULATORY_CARE_PROVIDER_SITE_OTHER): Payer: 59 | Admitting: Psychiatry

## 2022-03-02 ENCOUNTER — Encounter: Payer: Self-pay | Admitting: Psychiatry

## 2022-03-02 DIAGNOSIS — G4700 Insomnia, unspecified: Secondary | ICD-10-CM

## 2022-03-02 DIAGNOSIS — F411 Generalized anxiety disorder: Secondary | ICD-10-CM | POA: Diagnosis not present

## 2022-03-02 DIAGNOSIS — F39 Unspecified mood [affective] disorder: Secondary | ICD-10-CM | POA: Diagnosis not present

## 2022-03-02 DIAGNOSIS — G43809 Other migraine, not intractable, without status migrainosus: Secondary | ICD-10-CM | POA: Diagnosis not present

## 2022-03-02 MED ORDER — QUETIAPINE FUMARATE 50 MG PO TABS: ORAL_TABLET | ORAL | 1 refills | Status: DC

## 2022-03-02 MED ORDER — PROPRANOLOL HCL 10 MG PO TABS: ORAL_TABLET | ORAL | 2 refills | Status: DC

## 2022-03-02 MED ORDER — ALPRAZOLAM 0.5 MG PO TABS: ORAL_TABLET | ORAL | 5 refills | Status: DC

## 2022-03-02 MED ORDER — FLUOXETINE HCL 20 MG PO CAPS: ORAL_CAPSULE | ORAL | 1 refills | Status: DC

## 2022-03-02 MED ORDER — SUMATRIPTAN SUCCINATE 50 MG PO TABS: ORAL_TABLET | ORAL | 5 refills | Status: DC

## 2022-03-02 NOTE — Progress Notes (Signed)
Tracy ItoShana Cuda ?[id number] ?[birth year] ?46 y.o. ? ?Virtual Visit via Video Note ? ?I connected with pt @ on 03/02/22 at 12:45 PM EDT by a video enabled telemedicine application and verified that I am speaking with the correct person using two identifiers. ?  ?I discussed the limitations of evaluation and management by telemedicine and the availability of in person appointments. The patient expressed understanding and agreed to proceed. ? ?I discussed the assessment and treatment plan with the patient. The patient was provided an opportunity to ask questions and all were answered. The patient agreed with the plan and demonstrated an understanding of the instructions. ?  ?The patient was advised to call back or seek an in-person evaluation if the symptoms worsen or if the condition fails to improve as anticipated. ? ?I provided 15 minutes of non-face-to-face time during this encounter.  The patient was located at home.  [facility] ? ? ?Corie ChiquitoJessica Spenser Harren, PMHNP  ? ?Subjective:  ? ?Patient ID:  Tracy Lambert is a 46 y.o. (DOB [birth year]) female. ? ?Chief Complaint:  ?Chief Complaint  ?Patient presents with  ? Follow-up  ?  Anxiety, depression  ? ? ?HPI ?Tracy Lambert presents for follow-up of anxiety, depression, and insomnia. She reports, "things are really good." She reports that anxiety has been manageable. She reports that her mood has been "good, very good." She denies depressed mood. She reports that irritability has been ok. She reports that she is sleeping well with Seroquel. Appetite is ok. She reports that energy is fair and lower around menses. Motivation has been ok. Denies impaired concentration. Denies SI.  ? ?She reports that she is going to AA and is seeing Bradley FerrisDeb Young, LCAS. She reports that she is working on her sobriety.  ? ?Work has been going ok.  ? ?Denies involuntary movements.  ? ?She reports taking Xanax once daily, typically in the evening. Takes propranolol prn for  anxiety during the day.  ? ?Past Psychiatric Medication Trials: ?Cymbalta ?Zoloft  ?Prozac ?Buspar ?Seroquel- Reports that she felt over medicated on higher doses of Seroquel (possibly 200 mg) ?Xanax ?Propranolol ?Phentermine- increased anxiety, tension ?Topamax ? ?Review of Systems:  ?Review of Systems  ?Genitourinary:   ?     Notices some possible pre-menopausal s/s- heavier bleeding, hot flashes, fatigue. She notices some mood changes around her menstrual cycle "but not too bad."  ?Musculoskeletal:  Negative for gait problem.  ?Neurological:  Negative for tremors.  ?     She reports that Sumatriptan is effective for migraines.   ?Psychiatric/Behavioral:    ?     Please refer to HPI  ? ?Medications: I have reviewed the patient's current medications. ? ?Current Outpatient Medications  ?Medication Sig Dispense Refill  ? ASHWAGANDHA PO Take by mouth.    ? b complex vitamins capsule Take 1 capsule by mouth daily.    ? magnesium 30 MG tablet Take 30 mg by mouth 2 (two) times daily.    ? Multiple Vitamin (MULTIVITAMIN) tablet Take 1 tablet by mouth daily.    ? Pyridoxine HCl (VITAMIN B-6 PO) Take by mouth.    ? [START ON 03/12/2022] ALPRAZolam (XANAX) 0.5 MG tablet TAKE 1 TABLET BY MOUTH TWICE A DAY AS NEEDED FOR ANXIETY 60 tablet 5  ? FLUoxetine (PROZAC) 20 MG capsule TAKE 1 CAPSULE BY MOUTH EVERY DAY 90 capsule 1  ? propranolol (INDERAL) 10 MG tablet TAKE 1 TO 2 TABLETS BY MOUTH TWO TIMES A DAY AS NEEDED FOR  ANXIETY 120 tablet 2  ? QUEtiapine (SEROQUEL) 50 MG tablet TAKE 1-2 TABLETS BY MOUTH AT BEDTIME 180 tablet 1  ? SUMAtriptan (IMITREX) 50 MG tablet TAKE ONE TABLET BY MOUTH AT ONSET OF HEADACHE; MAY REPEAT ONE TABLET IN 2 HOURS IF NEEDED. 10 tablet 5  ? ?No current facility-administered medications for this visit.  ? ? ?Medication Side Effects: None ? ?Allergies: No Known Allergies ? ?Past Medical History:  ?Diagnosis Date  ? Migraines   ? ? ?Family History  ?Problem Relation Age of Onset  ? Arthritis Mother   ?  Alcohol abuse Mother   ? ? ?Social History  ? ?Socioeconomic History  ? Marital status: Single  ?  Spouse name: Not on file  ? Number of children: Not on file  ? Years of education: Not on file  ? Highest education level: Not on file  ?Occupational History  ? Not on file  ?Tobacco Use  ? Smoking status: Never  ? Smokeless tobacco: Never  ?Substance and Sexual Activity  ? Alcohol use: Yes  ?  Comment: Occ  ? Drug use: No  ? Sexual activity: Not on file  ?Other Topics Concern  ? Not on file  ?Social History Narrative  ? Not on file  ? ?Social Determinants of Health  ? ?Financial Resource Strain: Not on file  ?Food Insecurity: Not on file  ?Transportation Needs: Not on file  ?Physical Activity: Not on file  ?Stress: Not on file  ?Social Connections: Not on file  ?Intimate Partner Violence: Not on file  ? ? ?Past Medical History, Surgical history, Social history, and Family history were reviewed and updated as appropriate.  ? ?Please see review of systems for further details on the patient's review from today.  ? ?Objective:  ? ?Physical Exam:  ?There were no vitals taken for this visit. ? ?Physical Exam ?Constitutional:   ?   General: She is not in acute distress. ?Musculoskeletal:     ?   General: No deformity.  ?Neurological:  ?   Mental Status: She is alert and oriented to person, place, and time.  ?   Coordination: Coordination normal.  ?Psychiatric:     ?   Attention and Perception: Attention and perception normal. She does not perceive auditory or visual hallucinations.     ?   Mood and Affect: Mood normal. Mood is not anxious or depressed. Affect is not labile, blunt, angry or inappropriate.     ?   Speech: Speech normal.     ?   Behavior: Behavior normal.     ?   Thought Content: Thought content normal. Thought content is not paranoid or delusional. Thought content does not include homicidal or suicidal ideation. Thought content does not include homicidal or suicidal plan.     ?   Cognition and Memory:  Cognition and memory normal.     ?   Judgment: Judgment normal.  ?   Comments: Insight intact  ? ? ?Lab Review:  ?   ?Component Value Date/Time  ? NA 144 02/18/2016 2200  ? K 3.8 02/18/2016 2200  ? CL 106 02/18/2016 2200  ? CO2 25 02/18/2016 2200  ? GLUCOSE 108 (H) 02/18/2016 2200  ? BUN 10 02/18/2016 2200  ? CREATININE 0.74 02/18/2016 2200  ? CALCIUM 9.0 02/18/2016 2200  ? PROT 8.0 02/18/2016 2200  ? ALBUMIN 4.8 02/18/2016 2200  ? AST 27 02/18/2016 2200  ? ALT 30 02/18/2016 2200  ? ALKPHOS 53 02/18/2016 2200  ?  BILITOT 0.3 02/18/2016 2200  ? GFRNONAA >60 02/18/2016 2200  ? GFRAA >60 02/18/2016 2200  ? ? ?   ?Component Value Date/Time  ? WBC 7.6 02/18/2016 2200  ? RBC 4.70 02/18/2016 2200  ? HGB 14.5 02/18/2016 2200  ? HCT 42.2 02/18/2016 2200  ? PLT 346 02/18/2016 2200  ? MCV 89.8 02/18/2016 2200  ? MCH 30.9 02/18/2016 2200  ? MCHC 34.4 02/18/2016 2200  ? RDW 13.0 02/18/2016 2200  ? ? ?No results found for: POCLITH, LITHIUM  ? ?No results found for: PHENYTOIN, PHENOBARB, VALPROATE, CBMZ  ? ?.res ?Assessment: Plan:   ?She reports, "I want to keep everything status quo." Will continue current plan of care since target signs and symptoms are well controlled without any tolerability issues. ?Will continue Prozac 20 mg daily for PMDD, anxiety, and depression. ?Continue Xanax 0.5 mg twice daily for anxiety.   ?Continue Seroquel 50 mg 1 to 2 tablets at bedtime for insomnia. She reports that she would like to gradually decrease Seroquel in the future.  ?Continue sumatriptan as needed for migraines. ?Recommend continuing psychotherapy with Bradley Ferris, LCAS. ?Patient advised to contact office with any questions, adverse effects, or acute worsening in signs and symptoms. ?Pt to follow-up in 6 months or sooner if clinically indicated.  ? ? ?Ahlani was seen today for follow-up. ? ?Diagnoses and all orders for this visit: ? ?Anxiety state ?-     ALPRAZolam (XANAX) 0.5 MG tablet; TAKE 1 TABLET BY MOUTH TWICE A DAY AS NEEDED FOR  ANXIETY ?-     FLUoxetine (PROZAC) 20 MG capsule; TAKE 1 CAPSULE BY MOUTH EVERY DAY ?-     propranolol (INDERAL) 10 MG tablet; TAKE 1 TO 2 TABLETS BY MOUTH TWO TIMES A DAY AS NEEDED FOR ANXIETY ? ?Insomnia, unspeci

## 2022-05-23 ENCOUNTER — Telehealth: Payer: Self-pay | Admitting: Psychiatry

## 2022-05-23 NOTE — Telephone Encounter (Signed)
Sareena dropped off an FMLA form for employer to have completed. Placed in Traci's box to complete.

## 2022-05-24 ENCOUNTER — Telehealth: Payer: Self-pay | Admitting: Psychiatry

## 2022-05-24 NOTE — Telephone Encounter (Signed)
These have been received and discussed with Shanda Bumps. Will get completed asap.

## 2022-05-24 NOTE — Telephone Encounter (Signed)
Pt called last week reporting she needs FLMA paperwork completed. She is going to in patient rehab for 6 weeks. Bradley Ferris therapist emailed JC with details. Pt brought in FMLA paperwork to complete 7/10, due to JC on vacation last week. She is going to [place].  Please complete ASAP.

## 2022-05-25 ENCOUNTER — Telehealth: Payer: Self-pay | Admitting: Psychiatry

## 2022-05-25 DIAGNOSIS — Z0289 Encounter for other administrative examinations: Secondary | ICD-10-CM

## 2022-05-25 NOTE — Telephone Encounter (Signed)
Forms faxed

## 2022-05-25 NOTE — Telephone Encounter (Signed)
This is a duplicate message about her FMLA. See previous phone message

## 2022-05-25 NOTE — Telephone Encounter (Addendum)
Faxed FMLA paperwork Attn:Theresa Margo Aye. Pt notified

## 2022-05-25 NOTE — Telephone Encounter (Signed)
Forms completed and given to Jessica to review and sign °

## 2022-06-03 ENCOUNTER — Other Ambulatory Visit: Payer: Self-pay | Admitting: Psychiatry

## 2022-06-03 DIAGNOSIS — F411 Generalized anxiety disorder: Secondary | ICD-10-CM

## 2022-07-13 ENCOUNTER — Encounter: Payer: Self-pay | Admitting: Psychiatry

## 2022-07-13 ENCOUNTER — Ambulatory Visit (INDEPENDENT_AMBULATORY_CARE_PROVIDER_SITE_OTHER): Payer: 59 | Admitting: Psychiatry

## 2022-07-13 VITALS — BP 110/69 | HR 69

## 2022-07-13 DIAGNOSIS — F1021 Alcohol dependence, in remission: Secondary | ICD-10-CM | POA: Diagnosis not present

## 2022-07-13 DIAGNOSIS — F39 Unspecified mood [affective] disorder: Secondary | ICD-10-CM

## 2022-07-13 DIAGNOSIS — G4700 Insomnia, unspecified: Secondary | ICD-10-CM

## 2022-07-13 DIAGNOSIS — F411 Generalized anxiety disorder: Secondary | ICD-10-CM

## 2022-07-13 DIAGNOSIS — Z79899 Other long term (current) drug therapy: Secondary | ICD-10-CM

## 2022-07-13 MED ORDER — PROPRANOLOL HCL 20 MG PO TABS: 20.0000 mg | ORAL_TABLET | Freq: Every day | ORAL | 1 refills | Status: DC

## 2022-07-13 MED ORDER — TRAZODONE HCL 50 MG PO TABS: 50.0000 mg | ORAL_TABLET | Freq: Every day | ORAL | 2 refills | Status: DC

## 2022-07-13 MED ORDER — QUETIAPINE FUMARATE 100 MG PO TABS: 100.0000 mg | ORAL_TABLET | Freq: Every day | ORAL | 0 refills | Status: DC

## 2022-07-13 NOTE — Progress Notes (Signed)
Tracy Lambert [id number] 07/20/1976 46 y.o.  Subjective:   Patient ID:  Tracy Lambert is a 46 y.o. (DOB [birth year]) female.  Chief Complaint:  Chief Complaint  Patient presents with   Follow-up    Alcohol misuse, depression, anxiety    HPI Tracy Lambert presents to the office today for follow-up of alcohol dependence, depression, and anxiety. She reports that she got to a point where she was not able to stop drinking on her own and sought additional treatment. She went to [facility] for 6 weeks for Rehab and returned home yesterday. She reports that rehab was a positive experience overall and helpful in her recovery. She reports that today is day 49 of not drinking. She denies physical cravings for ETOH. She reports that she was weaned off Xanax during rehab and does not want to re-start it.   Anxiety is currently controlled. Mood has been "good." Denies depressed mood. Denies irritability. Sleeping well. Continued to take Seroquel during hospitalization. Also took Trazodone in rehab. Appetite has been good. Energy and motivation have been good. Concentration has improved. Denies anhedonia. Denies SI.   Has started reading again.   She has been on FMLA leave. She plans to take 2 weeks off and returning to part-time on 07/26/22 and full-time after returning from rehab and then full-time 08/09/22.   Continues to see Bradley Ferris, LCAS.   Son is doing well.   Past Psychiatric Medication Trials: Cymbalta Zoloft  Prozac Buspar Seroquel- Reports that she felt over medicated on higher doses of Seroquel (possibly 200 mg) Xanax Propranolol Phentermine- increased anxiety, tension Topamax  AIMS    Flowsheet Row Office Visit from 07/13/2022 in [facility] Office Visit from 09/01/2021 in [facility]  AIMS Total Score 0 0        Review of Systems:  Review of Systems  Musculoskeletal:  Negative for gait problem.  Neurological:  Negative for tremors.        About 3-4 migraines over 6 weeks  Psychiatric/Behavioral:         Please refer to HPI    Medications: I have reviewed the patient's current medications.  Current Outpatient Medications  Medication Sig Dispense Refill   b complex vitamins capsule Take 1 capsule by mouth daily.     FLUoxetine (PROZAC) 20 MG capsule TAKE 1 CAPSULE BY MOUTH EVERY DAY 90 capsule 1   Multiple Vitamin (MULTIVITAMIN) tablet Take 1 tablet by mouth daily.     Pyridoxine HCl (VITAMIN B-6 PO) Take by mouth.     SUMAtriptan (IMITREX) 50 MG tablet TAKE ONE TABLET BY MOUTH AT ONSET OF HEADACHE; MAY REPEAT ONE TABLET IN 2 HOURS IF NEEDED. 10 tablet 5   propranolol (INDERAL) 20 MG tablet Take 1 tablet (20 mg total) by mouth at bedtime. 30 tablet 1   QUEtiapine (SEROQUEL) 100 MG tablet Take 1 tablet (100 mg total) by mouth at bedtime. 90 tablet 0   traZODone (DESYREL) 50 MG tablet Take 1 tablet (50 mg total) by mouth at bedtime. 30 tablet 2   No current facility-administered medications for this visit.    Medication Side Effects: None  Allergies: No Known Allergies  Past Medical History:  Diagnosis Date   Migraines     Past Medical History, Surgical history, Social history, and Family history were reviewed and updated as appropriate.   Please see review of systems for further details on the patient's review from today.   Objective:   Physical Exam:  BP 110/69  Pulse 69   Physical Exam Constitutional:      General: She is not in acute distress. Musculoskeletal:        General: No deformity.  Neurological:     Mental Status: She is alert and oriented to person, place, and time.     Coordination: Coordination normal.  Psychiatric:        Attention and Perception: Attention and perception normal. She does not perceive auditory or visual hallucinations.        Mood and Affect: Mood normal. Mood is not anxious or depressed. Affect is not labile, blunt, angry or inappropriate.        Speech: Speech normal.         Behavior: Behavior normal.        Thought Content: Thought content normal. Thought content is not paranoid or delusional. Thought content does not include homicidal or suicidal ideation. Thought content does not include homicidal or suicidal plan.        Cognition and Memory: Cognition and memory normal.        Judgment: Judgment normal.     Comments: Insight intact     Lab Review:     Component Value Date/Time   NA 144 02/18/2016 2200   K 3.8 02/18/2016 2200   CL 106 02/18/2016 2200   CO2 25 02/18/2016 2200   GLUCOSE 108 (H) 02/18/2016 2200   BUN 10 02/18/2016 2200   CREATININE 0.74 02/18/2016 2200   CALCIUM 9.0 02/18/2016 2200   PROT 8.0 02/18/2016 2200   ALBUMIN 4.8 02/18/2016 2200   AST 27 02/18/2016 2200   ALT 30 02/18/2016 2200   ALKPHOS 53 02/18/2016 2200   BILITOT 0.3 02/18/2016 2200   GFRNONAA >60 02/18/2016 2200   GFRAA >60 02/18/2016 2200       Component Value Date/Time   WBC 7.6 02/18/2016 2200   RBC 4.70 02/18/2016 2200   HGB 14.5 02/18/2016 2200   HCT 42.2 02/18/2016 2200   PLT 346 02/18/2016 2200   MCV 89.8 02/18/2016 2200   MCH 30.9 02/18/2016 2200   MCHC 34.4 02/18/2016 2200   RDW 13.0 02/18/2016 2200    No results found for: "POCLITH", "LITHIUM"   No results found for: "PHENYTOIN", "PHENOBARB", "VALPROATE", "CBMZ"   .res Assessment: Plan:    Pt seen for 30 minutes and time spent discussing response to rehab treatment and reviewing current s/s. Agree with discontinuation of Xanax prn. Reviewed potential adverse effects of Seroquel and will order labs to monitor for potential adverse effects of Seroquel, as well as possible lab abnormalities associated with chronic ETOH use.  Continue Prozac 20 mg daily for mood and anxiety symptoms.  Continue Trazodone 50 mg po QHS for insomnia.  Continue Seroquel 100 mg po QHS for insomnia, mood and anxiety.  Continue Propranolol 20 mg po QHS for anxiety.  Recommend continuing psychotherapy with Bradley Ferris, LCAS. Pt to follow-up with this provider in 6-8 weeks or sooner if clinically indicated.  Patient advised to contact office with any questions, adverse effects, or acute worsening in signs and symptoms.   Piera was seen today for follow-up.  Diagnoses and all orders for this visit:  Alcohol dependence in early full remission (HCC)  Anxiety state -     propranolol (INDERAL) 20 MG tablet; Take 1 tablet (20 mg total) by mouth at bedtime.  Insomnia, unspecified type -     traZODone (DESYREL) 50 MG tablet; Take 1 tablet (50 mg total) by mouth at bedtime. -  QUEtiapine (SEROQUEL) 100 MG tablet; Take 1 tablet (100 mg total) by mouth at bedtime.  Episodic mood disorder (HCC)  High risk medication use -     Comprehensive metabolic panel -     Lipid panel -     CBC -     Hemoglobin A1c     Please see After Visit Summary for patient specific instructions.  Future Appointments  Date Time Provider Department Center  09/07/2022  9:30 AM Corie Chiquito, PMHNP [facility] None    Orders Placed This Encounter  Procedures   Comprehensive metabolic panel   Lipid panel   CBC   Hemoglobin A1c    -------------------------------

## 2022-08-05 ENCOUNTER — Other Ambulatory Visit: Payer: Self-pay | Admitting: Psychiatry

## 2022-08-05 DIAGNOSIS — F39 Unspecified mood [affective] disorder: Secondary | ICD-10-CM

## 2022-08-05 DIAGNOSIS — F411 Generalized anxiety disorder: Secondary | ICD-10-CM

## 2022-08-05 DIAGNOSIS — G4700 Insomnia, unspecified: Secondary | ICD-10-CM

## 2022-08-06 ENCOUNTER — Other Ambulatory Visit: Payer: Self-pay | Admitting: Psychiatry

## 2022-08-06 DIAGNOSIS — G4700 Insomnia, unspecified: Secondary | ICD-10-CM

## 2022-08-27 ENCOUNTER — Other Ambulatory Visit: Payer: Self-pay | Admitting: Psychiatry

## 2022-08-27 DIAGNOSIS — G4700 Insomnia, unspecified: Secondary | ICD-10-CM

## 2022-08-27 DIAGNOSIS — F411 Generalized anxiety disorder: Secondary | ICD-10-CM

## 2022-08-27 DIAGNOSIS — F39 Unspecified mood [affective] disorder: Secondary | ICD-10-CM

## 2022-09-07 ENCOUNTER — Ambulatory Visit: Payer: 59 | Admitting: Psychiatry

## 2022-09-21 ENCOUNTER — Ambulatory Visit: Payer: 59 | Admitting: Psychiatry

## 2022-10-05 ENCOUNTER — Telehealth (INDEPENDENT_AMBULATORY_CARE_PROVIDER_SITE_OTHER): Payer: 59 | Admitting: Psychiatry

## 2022-10-05 ENCOUNTER — Encounter: Payer: Self-pay | Admitting: Psychiatry

## 2022-10-05 VITALS — BP 130/82

## 2022-10-05 DIAGNOSIS — F411 Generalized anxiety disorder: Secondary | ICD-10-CM | POA: Diagnosis not present

## 2022-10-05 DIAGNOSIS — G43809 Other migraine, not intractable, without status migrainosus: Secondary | ICD-10-CM

## 2022-10-05 DIAGNOSIS — F39 Unspecified mood [affective] disorder: Secondary | ICD-10-CM

## 2022-10-05 DIAGNOSIS — F3281 Premenstrual dysphoric disorder: Secondary | ICD-10-CM

## 2022-10-05 DIAGNOSIS — G4700 Insomnia, unspecified: Secondary | ICD-10-CM

## 2022-10-05 MED ORDER — LAMOTRIGINE 25 MG PO TABS: ORAL_TABLET | ORAL | 0 refills | Status: DC

## 2022-10-05 MED ORDER — PROPRANOLOL HCL 20 MG PO TABS: 20.0000 mg | ORAL_TABLET | Freq: Every day | ORAL | 1 refills | Status: DC

## 2022-10-05 MED ORDER — FLUOXETINE HCL 20 MG PO CAPS: ORAL_CAPSULE | ORAL | 1 refills | Status: DC

## 2022-10-05 MED ORDER — SUMATRIPTAN SUCCINATE 50 MG PO TABS: ORAL_TABLET | ORAL | 5 refills | Status: DC

## 2022-10-05 MED ORDER — TRAZODONE HCL 50 MG PO TABS: 50.0000 mg | ORAL_TABLET | Freq: Every evening | ORAL | 1 refills | Status: DC | PRN

## 2022-10-05 MED ORDER — LAMOTRIGINE 100 MG PO TABS: ORAL_TABLET | ORAL | 2 refills | Status: DC

## 2022-10-05 MED ORDER — QUETIAPINE FUMARATE 100 MG PO TABS: ORAL_TABLET | ORAL | 1 refills | Status: DC

## 2022-10-05 NOTE — Progress Notes (Signed)
Tracy Lambert [id number] 1976/06/14 46 y.o.  Virtual Visit via Video Note  I connected with pt @ on 10/05/22 at  9:00 AM EST by a video enabled telemedicine application and verified that I am speaking with the correct person using two identifiers.   I discussed the limitations of evaluation and management by telemedicine and the availability of in person appointments. The patient expressed understanding and agreed to proceed.  I discussed the assessment and treatment plan with the patient. The patient was provided an opportunity to ask questions and all were answered. The patient agreed with the plan and demonstrated an understanding of the instructions.   The patient was advised to call back or seek an in-person evaluation if the symptoms worsen or if the condition fails to improve as anticipated.  I provided 30 minutes of non-face-to-face time during this encounter.  The patient was located in her personal vehicle in [place].  The provider was located at [facility].   Corie Chiquito, PMHNP   Subjective:   Patient ID:  Tracy Lambert is a 46 y.o. (DOB 17-Nov-1975) female.  Chief Complaint:  Chief Complaint  Patient presents with   Other    Premenstrual dysphoric symptoms   Follow-up    Depression, anxiety, and insomnia    HPI Tracy Lambert presents for follow-up of anxiety, depression, and insomnia. She reports, "there are hard days and there are easy days." She reports that she has some depression and irritability, typically 4-5 days right before onset of period. She reports that increase in Prozac does not seem to help PMDD s/s. She reports that her energy is "zapped" around that time. Anxiety has been ok and denies panic.   Sleeping ok and has been out of Seroquel for the last few days since pharmacy has told her that it can not be filled until a few days from now. She reports that she has only been taking one Seroquel 100 mg tab at bedtime.   She reports that her energy is  usually ok as long as she stays active. Appetite has been "normal." Denies impaired concentration. Denies SI.   She reports that her last drink was in July. She has been going to meetings.  Past Psychiatric Medication Trials: Cymbalta Zoloft  Prozac Buspar Seroquel- Reports that she felt over medicated on higher doses of Seroquel (possibly 200 mg) Xanax Propranolol Phentermine- increased anxiety, tension Topamax  Review of Systems:  Review of Systems  Genitourinary:        Regular menstrual periods  Musculoskeletal:  Negative for gait problem.  Neurological:  Negative for tremors.       She reports that her migraines occur about 2-3 times a month and are not as severe. Migraines respond to Imitrex  Psychiatric/Behavioral:         Please refer to HPI    Medications: I have reviewed the patient's current medications.  Current Outpatient Medications  Medication Sig Dispense Refill   b complex vitamins capsule Take 1 capsule by mouth daily.     lamoTRIgine (LAMICTAL) 100 MG tablet Take 1 tab po qd x 2 weeks, then increase to 1.5 tabs po qd 45 tablet 2   lamoTRIgine (LAMICTAL) 25 MG tablet Take 1 tablet (25 mg total) by mouth daily for 14 days, THEN 2 tablets (50 mg total) daily for 14 days. 45 tablet 0   Multiple Vitamin (MULTIVITAMIN) tablet Take 1 tablet by mouth daily.     Pyridoxine HCl (VITAMIN B-6 PO) Take by mouth.  FLUoxetine (PROZAC) 20 MG capsule TAKE 1 CAPSULE BY MOUTH EVERY DAY 90 capsule 1   propranolol (INDERAL) 20 MG tablet Take 1 tablet (20 mg total) by mouth at bedtime. 90 tablet 1   QUEtiapine (SEROQUEL) 100 MG tablet TAKE 1 TABLET BY MOUTH EVERYDAY AT BEDTIME 90 tablet 1   SUMAtriptan (IMITREX) 50 MG tablet TAKE ONE TABLET BY MOUTH AT ONSET OF HEADACHE; MAY REPEAT ONE TABLET IN 2 HOURS IF NEEDED. 10 tablet 5   traZODone (DESYREL) 50 MG tablet Take 1 tablet (50 mg total) by mouth at bedtime as needed for sleep. 90 tablet 1   No current facility-administered  medications for this visit.    Medication Side Effects: None  Allergies: No Known Allergies  Past Medical History:  Diagnosis Date   Migraines     Family History  Problem Relation Age of Onset   Arthritis Mother    Alcohol abuse Mother     Social History   Socioeconomic History   Marital status: Single    Spouse name: Not on file   Number of children: Not on file   Years of education: Not on file   Highest education level: Not on file  Occupational History   Not on file  Tobacco Use   Smoking status: Never   Smokeless tobacco: Never  Substance and Sexual Activity   Alcohol use: Yes    Comment: Occ   Drug use: No   Sexual activity: Not on file  Other Topics Concern   Not on file  Social History Narrative   Not on file   Social Determinants of Health   Financial Resource Strain: Not on file  Food Insecurity: Not on file  Transportation Needs: Not on file  Physical Activity: Not on file  Stress: Not on file  Social Connections: Not on file  Intimate Partner Violence: Not on file    Past Medical History, Surgical history, Social history, and Family history were reviewed and updated as appropriate.   Please see review of systems for further details on the patient's review from today.   Objective:   Physical Exam:  BP 130/82   Physical Exam Neurological:     Mental Status: She is alert and oriented to person, place, and time.     Cranial Nerves: No dysarthria.  Psychiatric:        Attention and Perception: Attention and perception normal.        Mood and Affect: Mood is not anxious.        Speech: Speech normal.        Behavior: Behavior is cooperative.        Thought Content: Thought content normal. Thought content is not paranoid or delusional. Thought content does not include homicidal or suicidal ideation. Thought content does not include homicidal or suicidal plan.        Cognition and Memory: Cognition and memory normal.        Judgment:  Judgment normal.     Comments: Insight intact Mood is mildly dysphoric     Lab Review:     Component Value Date/Time   NA 144 02/18/2016 2200   K 3.8 02/18/2016 2200   CL 106 02/18/2016 2200   CO2 25 02/18/2016 2200   GLUCOSE 108 (H) 02/18/2016 2200   BUN 10 02/18/2016 2200   CREATININE 0.74 02/18/2016 2200   CALCIUM 9.0 02/18/2016 2200   PROT 8.0 02/18/2016 2200   ALBUMIN 4.8 02/18/2016 2200   AST 27 02/18/2016 2200  ALT 30 02/18/2016 2200   ALKPHOS 53 02/18/2016 2200   BILITOT 0.3 02/18/2016 2200   GFRNONAA >60 02/18/2016 2200   GFRAA >60 02/18/2016 2200       Component Value Date/Time   WBC 7.6 02/18/2016 2200   RBC 4.70 02/18/2016 2200   HGB 14.5 02/18/2016 2200   HCT 42.2 02/18/2016 2200   PLT 346 02/18/2016 2200   MCV 89.8 02/18/2016 2200   MCH 30.9 02/18/2016 2200   MCHC 34.4 02/18/2016 2200   RDW 13.0 02/18/2016 2200    No results found for: "POCLITH", "LITHIUM"   No results found for: "PHENYTOIN", "PHENOBARB", "VALPROATE", "CBMZ"   .res Assessment: Plan:    Pt seen for 30 minutes and time spent discussing worsening mood symptoms prior to the onset of menses and discussed that these s/s are consistent with premenstrual dysphoric disorder. She reports that increasing Prozac around the time before the start of her period has been ineffective. Counseled patient regarding potential benefits, risks, and side effects of Lamictal to include potential risk of Stevens-Johnson syndrome. Discussed that Lamictal is approved for Bipolar depression and is used off-label for depression without Bipolar disorder and for Premenstrual Dysphoric disorder. Pt agrees to trial of Lamictal. Advised patient to stop taking Lamictal and contact office immediately if rash develops and to seek urgent medical attention if rash is severe and/or spreading quickly. Will start Lamictal 25 mg daily for 2 weeks, then increase to 50 mg daily for 2 weeks, then 100 mg daily for 2 weeks, then 150 mg  daily for mood symptoms.  Continue Seroquel 100 mg po QHS for mood and insomnia. New prescription sent to pharmacy requesting that Seroquel be filled today since pt has been without medication for several days. Also requested that clinical staff contact her pharmacy to request that Seroquel be filled today and to find out if there is an insurance or other issue preventing med from being filled. Will continue Prozac 20 mg po qd for anxiety and depression.  Continue Propranolol 20 mg po QHS for anxiety and insomnia.  Continue Trazodone 50 mg po QHS prn insomnia.  Continue Sumatriptan 50 mg po prn migraine headache.  Recommend continuing psychotherapy with Bradley Ferris, LCAS. Pt to follow-up in 3 months or sooner if clinically indicated.  Patient advised to contact office with any questions, adverse effects, or acute worsening in signs and symptoms.   Aowyn was seen today for other and follow-up.  Diagnoses and all orders for this visit:  Episodic mood disorder (HCC) -     lamoTRIgine (LAMICTAL) 25 MG tablet; Take 1 tablet (25 mg total) by mouth daily for 14 days, THEN 2 tablets (50 mg total) daily for 14 days. -     lamoTRIgine (LAMICTAL) 100 MG tablet; Take 1 tab po qd x 2 weeks, then increase to 1.5 tabs po qd -     FLUoxetine (PROZAC) 20 MG capsule; TAKE 1 CAPSULE BY MOUTH EVERY DAY -     QUEtiapine (SEROQUEL) 100 MG tablet; TAKE 1 TABLET BY MOUTH EVERYDAY AT BEDTIME  Premenstrual dysphoric disorder -     lamoTRIgine (LAMICTAL) 25 MG tablet; Take 1 tablet (25 mg total) by mouth daily for 14 days, THEN 2 tablets (50 mg total) daily for 14 days. -     lamoTRIgine (LAMICTAL) 100 MG tablet; Take 1 tab po qd x 2 weeks, then increase to 1.5 tabs po qd  Anxiety state -     FLUoxetine (PROZAC) 20 MG capsule; TAKE 1  CAPSULE BY MOUTH EVERY DAY -     propranolol (INDERAL) 20 MG tablet; Take 1 tablet (20 mg total) by mouth at bedtime. -     QUEtiapine (SEROQUEL) 100 MG tablet; TAKE 1 TABLET BY MOUTH  EVERYDAY AT BEDTIME  Insomnia, unspecified type -     QUEtiapine (SEROQUEL) 100 MG tablet; TAKE 1 TABLET BY MOUTH EVERYDAY AT BEDTIME -     traZODone (DESYREL) 50 MG tablet; Take 1 tablet (50 mg total) by mouth at bedtime as needed for sleep.  Other migraine without status migrainosus, not intractable -     SUMAtriptan (IMITREX) 50 MG tablet; TAKE ONE TABLET BY MOUTH AT ONSET OF HEADACHE; MAY REPEAT ONE TABLET IN 2 HOURS IF NEEDED.     Please see After Visit Summary for patient specific instructions.  No future appointments.   No orders of the defined types were placed in this encounter.     -------------------------------

## 2022-10-12 ENCOUNTER — Other Ambulatory Visit: Payer: Self-pay | Admitting: Psychiatry

## 2022-10-12 DIAGNOSIS — F39 Unspecified mood [affective] disorder: Secondary | ICD-10-CM

## 2022-10-12 DIAGNOSIS — F3281 Premenstrual dysphoric disorder: Secondary | ICD-10-CM

## 2022-10-20 ENCOUNTER — Other Ambulatory Visit: Payer: Self-pay | Admitting: Psychiatry

## 2022-10-20 DIAGNOSIS — F3281 Premenstrual dysphoric disorder: Secondary | ICD-10-CM

## 2022-10-20 DIAGNOSIS — F39 Unspecified mood [affective] disorder: Secondary | ICD-10-CM

## 2022-10-29 ENCOUNTER — Other Ambulatory Visit: Payer: Self-pay | Admitting: Psychiatry

## 2022-10-29 DIAGNOSIS — F39 Unspecified mood [affective] disorder: Secondary | ICD-10-CM

## 2022-10-29 DIAGNOSIS — F3281 Premenstrual dysphoric disorder: Secondary | ICD-10-CM

## 2022-11-18 ENCOUNTER — Other Ambulatory Visit: Payer: Self-pay | Admitting: Psychiatry

## 2022-11-18 DIAGNOSIS — F39 Unspecified mood [affective] disorder: Secondary | ICD-10-CM

## 2022-11-18 DIAGNOSIS — F3281 Premenstrual dysphoric disorder: Secondary | ICD-10-CM

## 2023-01-19 ENCOUNTER — Other Ambulatory Visit: Payer: Self-pay | Admitting: Psychiatry

## 2023-01-19 DIAGNOSIS — F3281 Premenstrual dysphoric disorder: Secondary | ICD-10-CM

## 2023-01-19 DIAGNOSIS — F411 Generalized anxiety disorder: Secondary | ICD-10-CM

## 2023-01-19 DIAGNOSIS — F39 Unspecified mood [affective] disorder: Secondary | ICD-10-CM

## 2023-01-22 NOTE — Telephone Encounter (Signed)
Please call to schedule an appt, was due last month.  

## 2023-01-23 NOTE — Telephone Encounter (Signed)
Lvm for pt to call and schedule

## 2023-01-27 ENCOUNTER — Other Ambulatory Visit: Payer: Self-pay | Admitting: Psychiatry

## 2023-01-27 DIAGNOSIS — F3281 Premenstrual dysphoric disorder: Secondary | ICD-10-CM

## 2023-01-27 DIAGNOSIS — F39 Unspecified mood [affective] disorder: Secondary | ICD-10-CM

## 2023-01-27 MED ORDER — LAMOTRIGINE 150 MG PO TABS: 150.0000 mg | ORAL_TABLET | Freq: Every day | ORAL | 0 refills | Status: DC

## 2023-01-27 NOTE — Addendum Note (Signed)
Addended by: Bonney Leitz T on: 01/27/2023 08:51 AM   Modules accepted: Orders

## 2023-01-27 NOTE — Addendum Note (Signed)
Addended by: Bonney Leitz T on: 01/27/2023 08:53 AM   Modules accepted: Orders

## 2023-01-27 NOTE — Telephone Encounter (Signed)
lamoTRIgine (LAMICTAL) 100 MG tablet; Take 1 tab po qd x 2 weeks, then increase to 1.5 tabs po qd   Please verify if it should be 1 qd or 1.5

## 2023-02-19 ENCOUNTER — Other Ambulatory Visit: Payer: Self-pay | Admitting: Psychiatry

## 2023-02-19 DIAGNOSIS — G4700 Insomnia, unspecified: Secondary | ICD-10-CM

## 2023-03-23 ENCOUNTER — Other Ambulatory Visit: Payer: Self-pay | Admitting: Psychiatry

## 2023-03-23 DIAGNOSIS — F411 Generalized anxiety disorder: Secondary | ICD-10-CM

## 2023-03-23 DIAGNOSIS — G4700 Insomnia, unspecified: Secondary | ICD-10-CM

## 2023-03-23 DIAGNOSIS — F39 Unspecified mood [affective] disorder: Secondary | ICD-10-CM

## 2023-03-23 NOTE — Telephone Encounter (Signed)
Please call to schedule Last seen 09/2022, RTC in 3 mo.

## 2023-03-24 ENCOUNTER — Telehealth: Payer: Self-pay | Admitting: Psychiatry

## 2023-03-24 DIAGNOSIS — F39 Unspecified mood [affective] disorder: Secondary | ICD-10-CM

## 2023-03-24 DIAGNOSIS — F411 Generalized anxiety disorder: Secondary | ICD-10-CM

## 2023-03-24 DIAGNOSIS — G4700 Insomnia, unspecified: Secondary | ICD-10-CM

## 2023-03-24 MED ORDER — QUETIAPINE FUMARATE 100 MG PO TABS: ORAL_TABLET | ORAL | 1 refills | Status: DC

## 2023-03-24 NOTE — Telephone Encounter (Signed)
Patient called in for refill on Quetiapine 100mg . Ph: [phone] [facility]acy [facility] [address] [place]

## 2023-03-24 NOTE — Telephone Encounter (Signed)
Sent!

## 2023-03-24 NOTE — Telephone Encounter (Signed)
Pt is scheduled for 5/15

## 2023-03-24 NOTE — Telephone Encounter (Signed)
Please contact pt to schedule apt. Last seen in Nov 2023.

## 2023-03-29 ENCOUNTER — Ambulatory Visit: Payer: 59 | Admitting: Psychiatry

## 2023-04-07 NOTE — Telephone Encounter (Signed)
Scheduled 6/12

## 2023-04-19 ENCOUNTER — Ambulatory Visit: Payer: 59 | Admitting: Psychiatry

## 2023-04-26 ENCOUNTER — Ambulatory Visit: Payer: 59 | Admitting: Psychiatry

## 2023-06-05 ENCOUNTER — Other Ambulatory Visit: Payer: Self-pay | Admitting: Psychiatry

## 2023-06-05 DIAGNOSIS — F411 Generalized anxiety disorder: Secondary | ICD-10-CM

## 2023-06-05 DIAGNOSIS — G4700 Insomnia, unspecified: Secondary | ICD-10-CM

## 2023-06-30 ENCOUNTER — Other Ambulatory Visit: Payer: Self-pay | Admitting: Psychiatry

## 2023-06-30 DIAGNOSIS — F411 Generalized anxiety disorder: Secondary | ICD-10-CM

## 2023-06-30 DIAGNOSIS — G4700 Insomnia, unspecified: Secondary | ICD-10-CM

## 2023-07-04 ENCOUNTER — Other Ambulatory Visit: Payer: Self-pay | Admitting: Psychiatry

## 2023-07-04 DIAGNOSIS — G43809 Other migraine, not intractable, without status migrainosus: Secondary | ICD-10-CM

## 2023-08-23 ENCOUNTER — Other Ambulatory Visit: Payer: Self-pay | Admitting: Psychiatry

## 2023-08-23 DIAGNOSIS — F411 Generalized anxiety disorder: Secondary | ICD-10-CM

## 2023-08-23 DIAGNOSIS — F3281 Premenstrual dysphoric disorder: Secondary | ICD-10-CM

## 2023-08-23 DIAGNOSIS — G4700 Insomnia, unspecified: Secondary | ICD-10-CM

## 2023-08-23 DIAGNOSIS — G43809 Other migraine, not intractable, without status migrainosus: Secondary | ICD-10-CM

## 2023-08-23 DIAGNOSIS — F39 Unspecified mood [affective] disorder: Secondary | ICD-10-CM

## 2023-08-24 ENCOUNTER — Telehealth: Payer: Self-pay | Admitting: Psychiatry

## 2023-08-24 ENCOUNTER — Other Ambulatory Visit: Payer: Self-pay

## 2023-08-24 DIAGNOSIS — F411 Generalized anxiety disorder: Secondary | ICD-10-CM

## 2023-08-24 DIAGNOSIS — G4700 Insomnia, unspecified: Secondary | ICD-10-CM

## 2023-08-24 DIAGNOSIS — F3281 Premenstrual dysphoric disorder: Secondary | ICD-10-CM

## 2023-08-24 DIAGNOSIS — F39 Unspecified mood [affective] disorder: Secondary | ICD-10-CM

## 2023-08-24 DIAGNOSIS — G43809 Other migraine, not intractable, without status migrainosus: Secondary | ICD-10-CM

## 2023-08-24 MED ORDER — SUMATRIPTAN SUCCINATE 50 MG PO TABS
ORAL_TABLET | ORAL | 0 refills | Status: DC
Start: 2023-08-24 — End: 2023-09-24

## 2023-08-24 MED ORDER — FLUOXETINE HCL 20 MG PO CAPS: ORAL_CAPSULE | ORAL | 0 refills | Status: DC

## 2023-08-24 MED ORDER — PROPRANOLOL HCL 20 MG PO TABS
20.0000 mg | ORAL_TABLET | Freq: Every day | ORAL | 0 refills | Status: DC
Start: 2023-08-24 — End: 2023-09-14

## 2023-08-24 MED ORDER — QUETIAPINE FUMARATE 100 MG PO TABS
ORAL_TABLET | ORAL | 0 refills | Status: DC
Start: 2023-08-24 — End: 2023-09-14

## 2023-08-24 MED ORDER — TRAZODONE HCL 50 MG PO TABS: 50.0000 mg | ORAL_TABLET | Freq: Every evening | ORAL | 0 refills | Status: DC | PRN

## 2023-08-24 NOTE — Telephone Encounter (Signed)
pended

## 2023-08-24 NOTE — Telephone Encounter (Signed)
Refills sent to bridge to appointment with exception of Lamotrigine. Lamotrigine has not been filled since 11/18/22 and medication will need to be started at low dose and titrated if there has been a lapse to prevent risk of Levonne Spiller Syndrome.

## 2023-08-24 NOTE — Telephone Encounter (Signed)
Patient called in for refill on medications Fluoxetine 20mg , Lamictal 150mg , Propranolol 20mg , Quetiapine 100mg , Sumatriptan 50mg , and Trazodone 50mg . PH: [phone]  appt 10/22 [facility] [address] [place], [place]

## 2023-09-05 ENCOUNTER — Ambulatory Visit: Payer: 59 | Admitting: Psychiatry

## 2023-09-14 ENCOUNTER — Encounter: Payer: Self-pay | Admitting: Psychiatry

## 2023-09-14 ENCOUNTER — Other Ambulatory Visit: Payer: Self-pay | Admitting: Psychiatry

## 2023-09-14 ENCOUNTER — Telehealth (INDEPENDENT_AMBULATORY_CARE_PROVIDER_SITE_OTHER): Payer: 59 | Admitting: Psychiatry

## 2023-09-14 DIAGNOSIS — F1021 Alcohol dependence, in remission: Secondary | ICD-10-CM

## 2023-09-14 DIAGNOSIS — F39 Unspecified mood [affective] disorder: Secondary | ICD-10-CM | POA: Diagnosis not present

## 2023-09-14 DIAGNOSIS — G4700 Insomnia, unspecified: Secondary | ICD-10-CM

## 2023-09-14 DIAGNOSIS — F411 Generalized anxiety disorder: Secondary | ICD-10-CM | POA: Diagnosis not present

## 2023-09-14 MED ORDER — TRAZODONE HCL 50 MG PO TABS: 50.0000 mg | ORAL_TABLET | Freq: Every evening | ORAL | 1 refills | Status: DC | PRN

## 2023-09-14 MED ORDER — FLUOXETINE HCL 40 MG PO CAPS: 40.0000 mg | ORAL_CAPSULE | Freq: Every day | ORAL | 1 refills | Status: DC

## 2023-09-14 MED ORDER — BUPROPION HCL ER (XL) 150 MG PO TB24
150.0000 mg | ORAL_TABLET | Freq: Every day | ORAL | 1 refills | Status: DC
Start: 2023-09-14 — End: 2024-03-20

## 2023-09-14 MED ORDER — ALPRAZOLAM 0.5 MG PO TABS
0.5000 mg | ORAL_TABLET | Freq: Every day | ORAL | 2 refills | Status: DC | PRN
Start: 2023-09-14 — End: 2023-12-08

## 2023-09-14 MED ORDER — QUETIAPINE FUMARATE 100 MG PO TABS: ORAL_TABLET | ORAL | 1 refills | Status: DC

## 2023-09-14 MED ORDER — PROPRANOLOL HCL 20 MG PO TABS
20.0000 mg | ORAL_TABLET | Freq: Every day | ORAL | 1 refills | Status: DC
Start: 2023-09-14 — End: 2024-04-17

## 2023-09-14 MED ORDER — NALTREXONE HCL 50 MG PO TABS
50.0000 mg | ORAL_TABLET | Freq: Every day | ORAL | 1 refills | Status: DC
Start: 2023-09-14 — End: 2024-01-25

## 2023-09-14 NOTE — Progress Notes (Signed)
Tracy Lambert [id number] 05-02-1976 47 y.o.  Virtual Visit via Video Note  I connected with pt @ on 09/14/23 at 12:30 PM EDT by a video enabled telemedicine application and verified that I am speaking with the correct person using two identifiers.   I discussed the limitations of evaluation and management by telemedicine and the availability of in person appointments. The patient expressed understanding and agreed to proceed.  I discussed the assessment and treatment plan with the patient. The patient was provided an opportunity to ask questions and all were answered. The patient agreed with the plan and demonstrated an understanding of the instructions.   The patient was advised to call back or seek an in-person evaluation if the symptoms worsen or if the condition fails to improve as anticipated.  I provided 28 minutes of non-face-to-face time during this encounter.  The patient was located in her personal vehicle in [name].  The provider was located at home.   Corie Chiquito, PMHNP   Subjective:   Patient ID:  Tracy Lambert is a 47 y.o. (DOB 1976/06/30) female.  Chief Complaint:  Chief Complaint  Patient presents with   Anxiety   Depression   Other    Alcohol cravings   Follow-up    Insomnia    HPI [name] presents for follow-up of anxiety, depression, and insomnia.  She reports that her mood has been "ok." She reports depression "comes and goes, especially around my period." She reports having some anxiety and feeling "overwhelmed... want to retreat." She reports that she has been experiencing panic on rare occasions. She would like to start walking. Energy and motivation have been low. She reports that sleep is adequate with Quetiapine. Takes 1/2-1 tab of Quetiapine at night. Appetite has been "fine." Some difficulty with concentration. She reports that she has chronic difficulty with concentration. Denies SI.   She continues to work in [employer].  She has been going to AA  and has a sponsor. She reports occasional small relapses. She reports that she has not seen therapist recently.   Reports that she did not start Lamictal.   Past Psychiatric Medication Trials: Cymbalta Zoloft  Prozac-Does not recall side effects with 40 mg.  Buspar Seroquel- Reports that she felt over medicated on higher doses of Seroquel (possibly 200 mg) Xanax Propranolol Phentermine- increased anxiety, tension Topamax   Review of Systems:  Review of Systems  Musculoskeletal:  Negative for gait problem.  Neurological:  Negative for tremors.  Psychiatric/Behavioral:         Please refer to HPI    Medications: I have reviewed the patient's current medications.  Current Outpatient Medications  Medication Sig Dispense Refill   b complex vitamins capsule Take 1 capsule by mouth daily.     buPROPion (WELLBUTRIN XL) 150 MG 24 hr tablet Take 1 tablet (150 mg total) by mouth daily. 90 tablet 1   Multiple Vitamin (MULTIVITAMIN) tablet Take 1 tablet by mouth daily.     naltrexone (DEPADE) 50 MG tablet Take 1 tablet (50 mg total) by mouth daily. 90 tablet 1   Pyridoxine HCl (VITAMIN B-6 PO) Take by mouth.     SUMAtriptan (IMITREX) 50 MG tablet TAKE ONE TABLET BY MOUTH AT ONSET OF HEADACHE; MAY REPEAT ONE TABLET IN 2 HOURS IF NEEDED. 10 tablet 0   ALPRAZolam (XANAX) 0.5 MG tablet Take 1 tablet (0.5 mg total) by mouth daily as needed for anxiety. 30 tablet 2   FLUoxetine (PROZAC) 40 MG capsule Take 1 capsule (40  mg total) by mouth daily. TAKE 1 CAPSULE BY MOUTH EVERY DAY 90 capsule 1   propranolol (INDERAL) 20 MG tablet Take 1 tablet (20 mg total) by mouth at bedtime. 90 tablet 1   QUEtiapine (SEROQUEL) 100 MG tablet TAKE 1 TABLET BY MOUTH EVERYDAY AT BEDTIME 90 tablet 1   traZODone (DESYREL) 50 MG tablet Take 1 tablet (50 mg total) by mouth at bedtime as needed. for sleep 90 tablet 1   No current facility-administered medications for this visit.    Medication Side Effects:  None  Allergies: No Known Allergies  Past Medical History:  Diagnosis Date   Migraines     Family History  Problem Relation Age of Onset   Arthritis Mother    Alcohol abuse Mother     Social History   Socioeconomic History   Marital status: Single    Spouse name: Not on file   Number of children: Not on file   Years of education: Not on file   Highest education level: Not on file  Occupational History   Not on file  Tobacco Use   Smoking status: Never   Smokeless tobacco: Never  Substance and Sexual Activity   Alcohol use: Yes    Comment: Occ   Drug use: No   Sexual activity: Not on file  Other Topics Concern   Not on file  Social History Narrative   Not on file   Social Determinants of Health   Financial Resource Strain: Not on file  Food Insecurity: Not on file  Transportation Needs: Not on file  Physical Activity: Not on file  Stress: Not on file  Social Connections: Not on file  Intimate Partner Violence: Not on file    Past Medical History, Surgical history, Social history, and Family history were reviewed and updated as appropriate.   Please see review of systems for further details on the patient's review from today.   Objective:   Physical Exam:  There were no vitals taken for this visit.  Physical Exam Constitutional:      General: She is not in acute distress. Musculoskeletal:        General: No deformity.  Neurological:     Mental Status: She is alert and oriented to person, place, and time.     Coordination: Coordination normal.  Psychiatric:        Attention and Perception: Attention and perception normal. She does not perceive auditory or visual hallucinations.        Mood and Affect: Mood is anxious and depressed. Affect is not labile, blunt, angry or inappropriate.        Speech: Speech normal.        Behavior: Behavior normal.        Thought Content: Thought content normal. Thought content is not paranoid or delusional. Thought  content does not include homicidal or suicidal ideation. Thought content does not include homicidal or suicidal plan.        Cognition and Memory: Cognition and memory normal.        Judgment: Judgment normal.     Comments: Insight intact     Lab Review:     Component Value Date/Time   NA 144 02/18/2016 2200   K 3.8 02/18/2016 2200   CL 106 02/18/2016 2200   CO2 25 02/18/2016 2200   GLUCOSE 108 (H) 02/18/2016 2200   BUN 10 02/18/2016 2200   CREATININE 0.74 02/18/2016 2200   CALCIUM 9.0 02/18/2016 2200   PROT 8.0 02/18/2016  2200   ALBUMIN 4.8 02/18/2016 2200   AST 27 02/18/2016 2200   ALT 30 02/18/2016 2200   ALKPHOS 53 02/18/2016 2200   BILITOT 0.3 02/18/2016 2200   GFRNONAA >60 02/18/2016 2200   GFRAA >60 02/18/2016 2200       Component Value Date/Time   WBC 7.6 02/18/2016 2200   RBC 4.70 02/18/2016 2200   HGB 14.5 02/18/2016 2200   HCT 42.2 02/18/2016 2200   PLT 346 02/18/2016 2200   MCV 89.8 02/18/2016 2200   MCH 30.9 02/18/2016 2200   MCHC 34.4 02/18/2016 2200   RDW 13.0 02/18/2016 2200    No results found for: "POCLITH", "LITHIUM"   No results found for: "PHENYTOIN", "PHENOBARB", "VALPROATE", "CBMZ"   .res Assessment: Plan:    Discussed treatment options for alcohol cravings, to include Naltrexone. Pt agrees to trial of Naltrexone. Will start Naltrexone 50 mg daily for alcohol cravings.  Discussed potential benefits, risks, and side effects of Wellbutrin XL 150 mg daily for depression. Will start Wellbutrin XL 150 mg daily for depression and to improve concentration.  Continue Seroquel 100 mg po at bedtime for insomnia, depression, and anxiety.  Continue Prozac 40 mg daily for depression and anxiety.  Will re-start Alprazolam 0.5 mg daily prn anxiety.  Continue Propranolol 20 mg at bedtime for insomnia. Continue Trazodone 50 mg at bedtime as needed for insomnia.  Patient advised to contact office with any questions, adverse effects, or acute worsening in  signs and symptoms.   Catarina was seen today for anxiety, depression, other and follow-up.  Diagnoses and all orders for this visit:  Anxiety state -     ALPRAZolam (XANAX) 0.5 MG tablet; Take 1 tablet (0.5 mg total) by mouth daily as needed for anxiety. -     FLUoxetine (PROZAC) 40 MG capsule; Take 1 capsule (40 mg total) by mouth daily. TAKE 1 CAPSULE BY MOUTH EVERY DAY -     propranolol (INDERAL) 20 MG tablet; Take 1 tablet (20 mg total) by mouth at bedtime. -     QUEtiapine (SEROQUEL) 100 MG tablet; TAKE 1 TABLET BY MOUTH EVERYDAY AT BEDTIME  Episodic mood disorder (HCC) -     FLUoxetine (PROZAC) 40 MG capsule; Take 1 capsule (40 mg total) by mouth daily. TAKE 1 CAPSULE BY MOUTH EVERY DAY -     buPROPion (WELLBUTRIN XL) 150 MG 24 hr tablet; Take 1 tablet (150 mg total) by mouth daily. -     QUEtiapine (SEROQUEL) 100 MG tablet; TAKE 1 TABLET BY MOUTH EVERYDAY AT BEDTIME  Insomnia, unspecified type -     traZODone (DESYREL) 50 MG tablet; Take 1 tablet (50 mg total) by mouth at bedtime as needed. for sleep -     QUEtiapine (SEROQUEL) 100 MG tablet; TAKE 1 TABLET BY MOUTH EVERYDAY AT BEDTIME  Alcohol dependence in early full remission (HCC) -     naltrexone (DEPADE) 50 MG tablet; Take 1 tablet (50 mg total) by mouth daily.     Please see After Visit Summary for patient specific instructions.  No future appointments.  No orders of the defined types were placed in this encounter.     -------------------------------

## 2023-09-22 ENCOUNTER — Other Ambulatory Visit: Payer: Self-pay | Admitting: Psychiatry

## 2023-09-22 DIAGNOSIS — F411 Generalized anxiety disorder: Secondary | ICD-10-CM

## 2023-09-22 DIAGNOSIS — F39 Unspecified mood [affective] disorder: Secondary | ICD-10-CM

## 2023-09-23 ENCOUNTER — Other Ambulatory Visit: Payer: Self-pay | Admitting: Psychiatry

## 2023-09-23 DIAGNOSIS — G43809 Other migraine, not intractable, without status migrainosus: Secondary | ICD-10-CM

## 2023-09-27 ENCOUNTER — Encounter: Payer: Self-pay | Admitting: Psychiatry

## 2023-10-24 ENCOUNTER — Other Ambulatory Visit: Payer: Self-pay | Admitting: Psychiatry

## 2023-10-24 DIAGNOSIS — F3281 Premenstrual dysphoric disorder: Secondary | ICD-10-CM

## 2023-10-24 DIAGNOSIS — F39 Unspecified mood [affective] disorder: Secondary | ICD-10-CM

## 2023-11-25 ENCOUNTER — Other Ambulatory Visit: Payer: Self-pay | Admitting: Psychiatry

## 2023-11-25 DIAGNOSIS — F411 Generalized anxiety disorder: Secondary | ICD-10-CM

## 2023-11-27 NOTE — Telephone Encounter (Signed)
 Please call to schedule an appt. Saw JC, but does not have an appt with anyone else.

## 2023-11-30 NOTE — Telephone Encounter (Signed)
Please call to schedule an appt. Saw JC, but does not have an appt with anyone else.

## 2023-12-04 NOTE — Telephone Encounter (Signed)
Pt made an appt with teresa in fbruary

## 2023-12-04 NOTE — Telephone Encounter (Signed)
Due 1/26

## 2024-01-03 ENCOUNTER — Ambulatory Visit: Payer: 59 | Admitting: Physician Assistant

## 2024-01-08 ENCOUNTER — Other Ambulatory Visit: Payer: Self-pay

## 2024-01-08 ENCOUNTER — Telehealth: Payer: Self-pay | Admitting: Physician Assistant

## 2024-01-08 DIAGNOSIS — F411 Generalized anxiety disorder: Secondary | ICD-10-CM

## 2024-01-08 MED ORDER — ALPRAZOLAM 0.5 MG PO TABS: 0.5000 mg | ORAL_TABLET | Freq: Every day | ORAL | 0 refills | Status: DC | PRN

## 2024-01-08 NOTE — Telephone Encounter (Signed)
 Tracy Lambert called at 11:18 to request refill of her Xanax.  Appt 3/5.  She said she Missed last appt due to the weather.  [id number] [place], [place] - [address]

## 2024-01-08 NOTE — Telephone Encounter (Signed)
 [facility] [address]

## 2024-01-10 ENCOUNTER — Other Ambulatory Visit: Payer: Self-pay

## 2024-01-10 DIAGNOSIS — G43809 Other migraine, not intractable, without status migrainosus: Secondary | ICD-10-CM

## 2024-01-10 MED ORDER — SUMATRIPTAN SUCCINATE 50 MG PO TABS: ORAL_TABLET | ORAL | 0 refills | Status: DC

## 2024-01-17 ENCOUNTER — Ambulatory Visit (INDEPENDENT_AMBULATORY_CARE_PROVIDER_SITE_OTHER): Payer: Self-pay | Admitting: Physician Assistant

## 2024-01-17 DIAGNOSIS — Z91199 Patient's noncompliance with other medical treatment and regimen due to unspecified reason: Secondary | ICD-10-CM

## 2024-01-17 NOTE — Progress Notes (Signed)
 No show

## 2024-01-25 ENCOUNTER — Other Ambulatory Visit: Payer: Self-pay | Admitting: Physician Assistant

## 2024-01-25 ENCOUNTER — Ambulatory Visit (INDEPENDENT_AMBULATORY_CARE_PROVIDER_SITE_OTHER): Admitting: Physician Assistant

## 2024-01-25 ENCOUNTER — Encounter: Payer: Self-pay | Admitting: Physician Assistant

## 2024-01-25 DIAGNOSIS — F1021 Alcohol dependence, in remission: Secondary | ICD-10-CM | POA: Diagnosis not present

## 2024-01-25 DIAGNOSIS — F411 Generalized anxiety disorder: Secondary | ICD-10-CM | POA: Diagnosis not present

## 2024-01-25 DIAGNOSIS — G4700 Insomnia, unspecified: Secondary | ICD-10-CM

## 2024-01-25 DIAGNOSIS — F39 Unspecified mood [affective] disorder: Secondary | ICD-10-CM | POA: Diagnosis not present

## 2024-01-25 MED ORDER — ACAMPROSATE CALCIUM 333 MG PO TBEC: 666.0000 mg | DELAYED_RELEASE_TABLET | Freq: Three times a day (TID) | ORAL | 1 refills | Status: DC

## 2024-01-25 MED ORDER — ALPRAZOLAM 0.5 MG PO TABS: 0.5000 mg | ORAL_TABLET | Freq: Every day | ORAL | 2 refills | Status: DC | PRN

## 2024-01-25 NOTE — Telephone Encounter (Signed)
 CAMPRAL IS BEING DISCONTINUED BY MANUFACTURER PLEASE SWITCH PT OVER TO NALTREXONE WHICH IS ONLY PRODUCT AVAILABLE.   Please see message above from pharmacy.

## 2024-01-25 NOTE — Progress Notes (Signed)
 [facility]  Patient ID: Tracy Lambert,  MRN: [id number]  PCP: Patient, No Pcp Per  Date of Evaluation: 01/25/2024 Time spent:25 minutes  Chief Complaint:  Chief Complaint   Alcohol Problem; Anxiety; Depression     HISTORY/CURRENT STATUS: HPI Transferring to my care from Corie Chiquito, NP who is no longer with the practice. Lost to follow-up for over a year. Former pt of mine from several years ago.   Wants to work on ETOH cravings. Its a constant battle. Sober for 3 months now.  Would like something to help with cravings.  Xanax is only taken at night. Has occas PA. Doesn't take xanax when drinking. A lot of the time, if she doesn't take it, she can't sleep.  Patient is able to enjoy things.  Energy and motivation are good.  Work is going well.   No extreme sadness, tearfulness, or feelings of hopelessness. ADLs and personal hygiene are normal.   Denies any changes in concentration, making decisions, or remembering things.  Appetite has not changed.  Weight is stable.  Denies suicidal or homicidal thoughts.  Patient denies increased energy with decreased need for sleep, increased talkativeness, racing thoughts, impulsivity or risky behaviors, increased spending, increased libido, grandiosity, increased irritability or anger, paranoia, or hallucinations.  Denies dizziness, syncope, seizures, numbness, tingling, tremor, tics, unsteady gait, slurred speech, confusion. Denies muscle or joint pain, stiffness, or dystonia.  Individual Medical History/ Review of Systems: Changes? :No   Past Psychiatric Medication Trials: Cymbalta Zoloft  Prozac Buspar Seroquel- Reports that she felt over medicated on higher doses of Seroquel (possibly 200 mg) Xanax Propranolol Phentermine- increased anxiety, tension Topamax  Rehab for ETOH in 2023, goes to AA sometimes.  Allergies: Patient has no known allergies.  Current Medications:  Current Outpatient Medications:     acamprosate (CAMPRAL) 333 MG tablet, Take 2 tablets (666 mg total) by mouth 3 (three) times daily with meals., Disp: 180 tablet, Rfl: 1   b complex vitamins capsule, Take 1 capsule by mouth daily., Disp: , Rfl:    buPROPion (WELLBUTRIN XL) 150 MG 24 hr tablet, Take 1 tablet (150 mg total) by mouth daily. (Patient taking differently: Take 150 mg by mouth daily. 'sometimes'), Disp: 90 tablet, Rfl: 1   Multiple Vitamin (MULTIVITAMIN) tablet, Take 1 tablet by mouth daily., Disp: , Rfl:    Pyridoxine HCl (VITAMIN B-6 PO), Take by mouth., Disp: , Rfl:    QUEtiapine (SEROQUEL) 100 MG tablet, TAKE 1 TABLET BY MOUTH EVERYDAY AT BEDTIME, Disp: 90 tablet, Rfl: 1   SUMAtriptan (IMITREX) 50 MG tablet, TAKE ONE TABLET BY MOUTH AT ONSET OF HEADACHE; MAY REPEAT ONE TABLET IN 2 HOURS IF NEEDED., Disp: 10 tablet, Rfl: 0   ALPRAZolam (XANAX) 0.5 MG tablet, Take 1 tablet (0.5 mg total) by mouth daily as needed for anxiety., Disp: 30 tablet, Rfl: 2   FLUoxetine (PROZAC) 40 MG capsule, Take 1 capsule (40 mg total) by mouth daily. TAKE 1 CAPSULE BY MOUTH EVERY DAY, Disp: 90 capsule, Rfl: 1   propranolol (INDERAL) 20 MG tablet, Take 1 tablet (20 mg total) by mouth at bedtime., Disp: 90 tablet, Rfl: 1   traZODone (DESYREL) 50 MG tablet, Take 1 tablet (50 mg total) by mouth at bedtime as needed. for sleep, Disp: 90 tablet, Rfl: 1 Medication Side Effects: none  Family Medical/ Social History: Changes? No  MENTAL HEALTH EXAM:  There were no vitals taken for this visit.There is no height or weight on file to calculate BMI.  General Appearance: Casual and Well Groomed  Eye Contact:  Good  Speech:  Clear and Coherent and Normal Rate  Volume:  Normal  Mood:  Euthymic  Affect:  Congruent  Thought Process:  Goal Directed and Descriptions of Associations: Circumstantial  Orientation:  Full (Time, Place, and Person)  Thought Content: Logical   Suicidal Thoughts:  No  Homicidal Thoughts:  No  Memory:  WNL  Judgement:  Good   Insight:  Good  Psychomotor Activity:  Normal  Concentration:  Concentration: Good  Recall:  Good  Fund of Knowledge: Good  Language: Good  Assets:  Communication Skills Desire for Improvement Financial Resources/Insurance Housing Transportation Vocational/Educational  ADL's:  Intact  Cognition: WNL  Prognosis:  Good   DIAGNOSES:    ICD-10-CM   1. Episodic mood disorder (HCC)  F39     2. Anxiety state  F41.1 ALPRAZolam (XANAX) 0.5 MG tablet    3. Alcohol dependence in early full remission (HCC)  F10.21     4. Insomnia, unspecified type  G47.00      Receiving Psychotherapy: No   RECOMMENDATIONS:   PDMP reviewed.  Xanax filled 01/08/2024. I provided 25 minutes of face to face time during this encounter, including time spent before and after the visit in records review, medical decision making, counseling pertinent to today's visit, and charting.   Congratulations on almost 3 months sober! I recommend Campral. She's taken Naltrxone and it wasn't helpful.   Start Campral 333mg , 2 po tid. Continue xanax 0.5 mg, 1 po at bedtime prn.  Do not mix with ETOH. Continue Prozac 40 mg, 1 every day. Continue Seroquel 100 mg at bedtime. Continue Trazedone 50 mg, 1 at bedtime prn. Go to AA at least once a week. Recommend therapy. Return in 6 weeks.  Melony Overly, PA-C

## 2024-01-26 ENCOUNTER — Telehealth: Payer: Self-pay

## 2024-01-26 NOTE — Telephone Encounter (Signed)
 LVM to [facility]

## 2024-01-26 NOTE — Telephone Encounter (Signed)
 Please let the patient know this.  She has taken naltrexone in the past and still drank anyway.  So that is not a good option.  Another choice would be Antabuse, it causes nausea and vomiting if you consume alcohol when on it.  I can send that and if she is willing to try it.

## 2024-01-28 NOTE — Telephone Encounter (Signed)
 Tracy Lambert

## 2024-01-30 NOTE — Telephone Encounter (Signed)
 Noted.

## 2024-02-16 ENCOUNTER — Other Ambulatory Visit: Payer: Self-pay | Admitting: Physician Assistant

## 2024-02-17 ENCOUNTER — Other Ambulatory Visit: Payer: Self-pay | Admitting: Physician Assistant

## 2024-02-20 ENCOUNTER — Other Ambulatory Visit: Payer: Self-pay | Admitting: Physician Assistant

## 2024-02-20 DIAGNOSIS — G43809 Other migraine, not intractable, without status migrainosus: Secondary | ICD-10-CM

## 2024-03-20 ENCOUNTER — Ambulatory Visit: Admitting: Physician Assistant

## 2024-03-20 ENCOUNTER — Encounter: Payer: Self-pay | Admitting: Physician Assistant

## 2024-03-20 DIAGNOSIS — F1021 Alcohol dependence, in remission: Secondary | ICD-10-CM

## 2024-03-20 DIAGNOSIS — G4700 Insomnia, unspecified: Secondary | ICD-10-CM

## 2024-03-20 DIAGNOSIS — F411 Generalized anxiety disorder: Secondary | ICD-10-CM

## 2024-03-20 DIAGNOSIS — F39 Unspecified mood [affective] disorder: Secondary | ICD-10-CM

## 2024-03-20 MED ORDER — QUETIAPINE FUMARATE 100 MG PO TABS: ORAL_TABLET | ORAL | 1 refills | Status: DC

## 2024-03-20 MED ORDER — TRAZODONE HCL 50 MG PO TABS: 50.0000 mg | ORAL_TABLET | Freq: Every evening | ORAL | 1 refills | Status: DC | PRN

## 2024-03-20 MED ORDER — ALPRAZOLAM 0.5 MG PO TABS: 0.5000 mg | ORAL_TABLET | Freq: Every day | ORAL | 2 refills | Status: DC | PRN

## 2024-03-20 MED ORDER — FLUOXETINE HCL 40 MG PO CAPS: 40.0000 mg | ORAL_CAPSULE | Freq: Every day | ORAL | 1 refills | Status: DC

## 2024-03-20 MED ORDER — BUPROPION HCL ER (XL) 150 MG PO TB24: 150.0000 mg | ORAL_TABLET | Freq: Every day | ORAL | 1 refills | Status: DC

## 2024-03-20 NOTE — Progress Notes (Signed)
 [facility]  Patient ID: Tracy Lambert,  MRN: [id number]  PCP: Patient, No Pcp Per  Date of Evaluation: 03/20/2024 Time spent:20 minutes  Chief Complaint:  Chief Complaint   Follow-up    HISTORY/CURRENT STATUS: HPI For med f/u  She wasn't able to get the Campral  d/t being on back order. She's used Naltrexone  before and 'drank through it' so didn't want to take it.  Hasn't had any ETOH since 10/2023. "I'm doing ok.  I don't have any cravings right now."  Patient is able to enjoy things.  Energy and motivation are good.  Work is going well.   No extreme sadness, tearfulness, or feelings of hopelessness.  Sleeps well most of the time. ADLs and personal hygiene are normal.   Denies any changes in concentration, making decisions, or remembering things.  Appetite has not changed.  Weight is stable. Anxiety, being overwhelmed, is almost daily.  Xanax  is helpful.  Denies suicidal or homicidal thoughts.  Patient denies increased energy with decreased need for sleep, increased talkativeness, racing thoughts, impulsivity or risky behaviors, increased spending, increased libido, grandiosity, increased irritability or anger, paranoia, or hallucinations.  Denies dizziness, syncope, seizures, numbness, tingling, tremor, tics, unsteady gait, slurred speech, confusion. Denies muscle or joint pain, stiffness, or dystonia.  Individual Medical History/ Review of Systems: Changes? :No   Past Psychiatric Medication Trials: Cymbalta  Zoloft  Prozac  Buspar Seroquel - Reports that she felt over medicated on higher doses of Seroquel  (possibly 200 mg) Xanax  Propranolol  Phentermine- increased anxiety, tension Topamax   Rehab for ETOH in 2023, goes to AA sometimes.  Allergies: Patient has no known allergies.  Current Medications:  Current Outpatient Medications:    b complex vitamins capsule, Take 1 capsule by mouth daily., Disp: , Rfl:    Multiple Vitamin (MULTIVITAMIN) tablet, Take 1 tablet  by mouth daily., Disp: , Rfl:    Pyridoxine HCl (VITAMIN B-6 PO), Take by mouth., Disp: , Rfl:    SUMAtriptan  (IMITREX ) 50 MG tablet, TAKE ONE TABLET BY MOUTH AT ONSET OF HEADACHE MAY REPEAT ONE TABLET IN 2 HOURS IF NEEDED., Disp: 9 tablet, Rfl: 1   ALPRAZolam  (XANAX ) 0.5 MG tablet, Take 1 tablet (0.5 mg total) by mouth daily as needed for anxiety., Disp: 30 tablet, Rfl: 2   buPROPion  (WELLBUTRIN  XL) 150 MG 24 hr tablet, Take 1 tablet (150 mg total) by mouth daily., Disp: 90 tablet, Rfl: 1   FLUoxetine  (PROZAC ) 40 MG capsule, Take 1 capsule (40 mg total) by mouth daily. TAKE 1 CAPSULE BY MOUTH EVERY DAY, Disp: 90 capsule, Rfl: 1   propranolol  (INDERAL ) 20 MG tablet, Take 1 tablet (20 mg total) by mouth at bedtime., Disp: 90 tablet, Rfl: 1   QUEtiapine  (SEROQUEL ) 100 MG tablet, TAKE 1 TABLET BY MOUTH EVERYDAY AT BEDTIME, Disp: 90 tablet, Rfl: 1   traZODone  (DESYREL ) 50 MG tablet, Take 1 tablet (50 mg total) by mouth at bedtime as needed. for sleep, Disp: 90 tablet, Rfl: 1 Medication Side Effects: none  Family Medical/ Social History: Changes? No  MENTAL HEALTH EXAM:  There were no vitals taken for this visit.There is no height or weight on file to calculate BMI.  General Appearance: Casual and Well Groomed  Eye Contact:  Good  Speech:  Clear and Coherent and Normal Rate  Volume:  Normal  Mood:  Euthymic  Affect:  Congruent  Thought Process:  Goal Directed and Descriptions of Associations: Circumstantial  Orientation:  Full (Time, Place, and Person)  Thought Content: Logical   Suicidal  Thoughts:  No  Homicidal Thoughts:  No  Memory:  WNL  Judgement:  Good  Insight:  Good  Psychomotor Activity:  Normal  Concentration:  Concentration: Good  Recall:  Good  Fund of Knowledge: Good  Language: Good  Assets:  Communication Skills Desire for Improvement Financial Resources/Insurance Housing Resilience Transportation Vocational/Educational  ADL's:  Intact  Cognition: WNL  Prognosis:   Good   DIAGNOSES:    ICD-10-CM   1. Alcohol use disorder, moderate, in early remission (HCC)  F10.21     2. Episodic mood disorder (HCC)  F39 buPROPion  (WELLBUTRIN  XL) 150 MG 24 hr tablet    FLUoxetine  (PROZAC ) 40 MG capsule    QUEtiapine  (SEROQUEL ) 100 MG tablet    3. Anxiety state  F41.1 FLUoxetine  (PROZAC ) 40 MG capsule    QUEtiapine  (SEROQUEL ) 100 MG tablet    ALPRAZolam  (XANAX ) 0.5 MG tablet    4. Insomnia, unspecified type  G47.00 QUEtiapine  (SEROQUEL ) 100 MG tablet    traZODone  (DESYREL ) 50 MG tablet      Receiving Psychotherapy: No   RECOMMENDATIONS:   PDMP reviewed.  Xanax  filled 03/07/2024. I provided 20 minutes of face to face time during this encounter, including time spent before and after the visit in records review, medical decision making, counseling pertinent to today's visit, and charting.   Accamprosate is on back order d/t national shortage, is the word we're getting. I've also heard that it will no longer be made. If she starts having any cravings at all, take a naltrexone . (She has some left from prev Rx) Also call her pharmacy every week to see if they have the Accamprosate in.  She's doing well with other meds so no changes will be made.   Continue xanax  0.5 mg, 1 po at bedtime prn.  Continue Prozac  40 mg, 1 every day. Continue Seroquel  100 mg at bedtime. Continue Trazodone  50 mg, 1 at bedtime prn. Go to AA at least once a week. Recommend therapy. Return in 3 months.  Marvia Slocumb, PA-C

## 2024-04-17 ENCOUNTER — Other Ambulatory Visit: Payer: Self-pay | Admitting: Physician Assistant

## 2024-04-17 DIAGNOSIS — F411 Generalized anxiety disorder: Secondary | ICD-10-CM

## 2024-06-17 ENCOUNTER — Telehealth (HOSPITAL_COMMUNITY): Payer: Self-pay | Admitting: Licensed Clinical Social Worker

## 2024-06-17 NOTE — Telephone Encounter (Signed)
 The therapist returns Tracy Lambert's calling confirming her identity via two identifiers. 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  She is interested in taking FMLA and doing a [facility] IOP saying that she completed [facility] 2 years ago and was sober for about 4 months afterwards.She says that if she drinks two small boxes of wine that she can function but if she drinks three or four she cannot. In addition to the alcohol, she has been taking one, .5 mg Xanax , q hs saying that she has been back on it for 6-8 months knowing that she needs to get off it as well.  The therapist talks to her about the dangers of abruptly stopping both alcohol and benzodiazepines with [name] denying any prior history of tremors, seizures, etcetera adding that she has been able to go a week at a time with no problems.   She schedules to come for an assessment on Friday, 06/21/24 at 8:30 a.m.   Zell Maier, MA, LCSW, [facility], LCAS 06/17/2024

## 2024-06-19 ENCOUNTER — Ambulatory Visit (INDEPENDENT_AMBULATORY_CARE_PROVIDER_SITE_OTHER): Admitting: Physician Assistant

## 2024-06-19 ENCOUNTER — Encounter: Payer: Self-pay | Admitting: Physician Assistant

## 2024-06-19 DIAGNOSIS — F1021 Alcohol dependence, in remission: Secondary | ICD-10-CM

## 2024-06-19 DIAGNOSIS — F411 Generalized anxiety disorder: Secondary | ICD-10-CM | POA: Diagnosis not present

## 2024-06-19 DIAGNOSIS — F39 Unspecified mood [affective] disorder: Secondary | ICD-10-CM

## 2024-06-19 MED ORDER — ACAMPROSATE CALCIUM 333 MG PO TBEC: 666.0000 mg | DELAYED_RELEASE_TABLET | Freq: Three times a day (TID) | ORAL | 1 refills | Status: DC

## 2024-06-19 MED ORDER — BUPROPION HCL ER (XL) 150 MG PO TB24: 150.0000 mg | ORAL_TABLET | Freq: Every day | ORAL | 1 refills | Status: DC

## 2024-06-19 NOTE — Progress Notes (Signed)
 [facility]  Patient ID: Tracy Lambert,  MRN: [id number]  PCP: Patient, No Pcp Per  Date of Evaluation: 06/19/2024 Time spent:30 minutes  Chief Complaint:  Chief Complaint   Follow-up    HISTORY/CURRENT STATUS: HPI For med f/u  Had relapse of ETOH last week, drank for a couple of days. Missed work 3 days.  She wants to go back tomorrow, hasn't had alcohol in a few days and feels she is ready to go back to work. Interested in meds to help with ETOH cravings.   Felt down and overwhelmed but mood is better now.  Energy and motivation are good.  No extreme sadness, tearfulness, or feelings of hopelessness.  Sleeps ok.  ADLs and personal hygiene are normal.   Denies any changes in concentration, making decisions, or remembering things.  Appetite has not changed.  Weight is stable. Anxiety is controlled.  Takes Xanax  that she's been on for a long time. No mania, delirium, AH/VH.  No SI/HI.  Denies dizziness, syncope, seizures, numbness, tingling, tremor, tics, unsteady gait, slurred speech, confusion.  Denies muscle or joint pain, stiffness, or dystonia.  Individual Medical History/ Review of Systems: Changes? :No   Past Psychiatric Medication Trials: Cymbalta  Zoloft  Prozac  Buspar Seroquel - Reports that she felt over medicated on higher doses of Seroquel  (possibly 200 mg) Xanax  Propranolol  Phentermine- increased anxiety, tension Topamax   Rehab for ETOH in 2023, goes to AA sometimes.  Allergies: Patient has no known allergies.  Current Medications:  Current Outpatient Medications:    acamprosate  (CAMPRAL ) 333 MG tablet, Take 2 tablets (666 mg total) by mouth 3 (three) times daily with meals., Disp: 180 tablet, Rfl: 1   ALPRAZolam  (XANAX ) 0.5 MG tablet, Take 1 tablet (0.5 mg total) by mouth daily as needed for anxiety., Disp: 30 tablet, Rfl: 2   b complex vitamins capsule, Take 1 capsule by mouth daily., Disp: , Rfl:    buPROPion  (WELLBUTRIN  XL) 150 MG 24 hr tablet,  Take 1 tablet (150 mg total) by mouth daily., Disp: 90 tablet, Rfl: 1   FLUoxetine  (PROZAC ) 40 MG capsule, Take 1 capsule (40 mg total) by mouth daily. TAKE 1 CAPSULE BY MOUTH EVERY DAY, Disp: 90 capsule, Rfl: 1   Multiple Vitamin (MULTIVITAMIN) tablet, Take 1 tablet by mouth daily., Disp: , Rfl:    propranolol  (INDERAL ) 20 MG tablet, TAKE 1 TABLET BY MOUTH EVERYDAY AT BEDTIME, Disp: 90 tablet, Rfl: 0   Pyridoxine HCl (VITAMIN B-6 PO), Take by mouth., Disp: , Rfl:    QUEtiapine  (SEROQUEL ) 100 MG tablet, TAKE 1 TABLET BY MOUTH EVERYDAY AT BEDTIME, Disp: 90 tablet, Rfl: 1   SUMAtriptan  (IMITREX ) 50 MG tablet, TAKE ONE TABLET BY MOUTH AT ONSET OF HEADACHE MAY REPEAT ONE TABLET IN 2 HOURS IF NEEDED., Disp: 9 tablet, Rfl: 1   traZODone  (DESYREL ) 50 MG tablet, Take 1 tablet (50 mg total) by mouth at bedtime as needed. for sleep, Disp: 90 tablet, Rfl: 1 Medication Side Effects: none  Family Medical/ Social History: Changes? Dad died in 04/19/2024.   MENTAL HEALTH EXAM:  There were no vitals taken for this visit.There is no height or weight on file to calculate BMI.  General Appearance: Casual and Well Groomed  Eye Contact:  Good  Speech:  Clear and Coherent and Normal Rate  Volume:  Normal  Mood:  Euthymic  Affect:  Congruent  Thought Process:  Goal Directed and Descriptions of Associations: Circumstantial  Orientation:  Full (Time, Place, and Person)  Thought Content: Logical  Suicidal Thoughts:  No  Homicidal Thoughts:  No  Memory:  WNL  Judgement:  Good  Insight:  Good  Psychomotor Activity:  Normal  Concentration:  Concentration: Good  Recall:  Good  Fund of Knowledge: Good  Language: Good  Assets:  Communication Skills Desire for Improvement Financial Resources/Insurance Housing Physical Health Resilience Transportation Vocational/Educational  ADL's:  Intact  Cognition: WNL  Prognosis:  Good   DIAGNOSES:    ICD-10-CM   1. Alcohol dependence in early full remission (HCC)   F10.21     2. Episodic mood disorder (HCC)  F39 buPROPion  (WELLBUTRIN  XL) 150 MG 24 hr tablet    3. Generalized anxiety disorder  F41.1      Receiving Psychotherapy: No   RECOMMENDATIONS:   PDMP reviewed.  Xanax  filled 06/04/2024. I provided approximately  30 minutes of face to face time during this encounter, including time spent before and after the visit in records review, medical decision making, counseling pertinent to today's visit, and charting.   Discussed treatment options for ETOH prob.  We've discussed Campral  in the past. She'd like to start it.   Note given for [facility] the 3 days, see on chart.  FMLA- 32 hours work week with same job duties.   Start Campral  333 mg, 2 po tid. Continue xanax  0.5 mg, 1 po at bedtime prn. Take as sparingly as possible and do not mix w/ alcohol.  Continue Wellbutrin  XL 150 mg, 1  am. Continue Prozac  40 mg, 1 every day. Continue Seroquel  100 mg at bedtime. Continue Trazodone  50 mg, 1 at bedtime prn. Continue AA. Recommend therapy. Return in 6 wks.    Verneita Cooks, PA-C

## 2024-06-20 ENCOUNTER — Telehealth: Payer: Self-pay | Admitting: Physician Assistant

## 2024-06-20 DIAGNOSIS — Z0289 Encounter for other administrative examinations: Secondary | ICD-10-CM

## 2024-06-20 NOTE — Telephone Encounter (Signed)
 Noted

## 2024-06-20 NOTE — Telephone Encounter (Signed)
 Received fax FMLA ppwk from [employer] to complete. Gave to Traci. Pt paid $45

## 2024-06-20 NOTE — Telephone Encounter (Signed)
 Pt wants to note that she is requesting modified work hours for [employer] of 32 per week. No change in duties.

## 2024-06-21 ENCOUNTER — Telehealth (HOSPITAL_COMMUNITY): Payer: Self-pay | Admitting: Licensed Clinical Social Worker

## 2024-06-21 NOTE — Telephone Encounter (Signed)
 The therapist returns Tracy Lambert's call confirming her identity via two identifiers. He answers her questions about the days [facility] IOP meets and the average length of stay.  Zell Maier, MA, LCSW, [facility], LCAS 06/21/2024

## 2024-06-21 NOTE — Telephone Encounter (Signed)
 For your office note pt called requesting 32 hours per week with same job duties.

## 2024-06-21 NOTE — Telephone Encounter (Signed)
 Noted

## 2024-06-24 NOTE — Telephone Encounter (Signed)
 See other phone note

## 2024-06-24 NOTE — Telephone Encounter (Signed)
Paper work completed and given to Glacier to review and sign.

## 2024-06-26 ENCOUNTER — Encounter (HOSPITAL_COMMUNITY): Payer: Self-pay

## 2024-06-26 ENCOUNTER — Ambulatory Visit (INDEPENDENT_AMBULATORY_CARE_PROVIDER_SITE_OTHER): Admitting: Licensed Clinical Social Worker

## 2024-06-26 DIAGNOSIS — Z79899 Other long term (current) drug therapy: Secondary | ICD-10-CM

## 2024-06-26 DIAGNOSIS — F1020 Alcohol dependence, uncomplicated: Secondary | ICD-10-CM | POA: Diagnosis not present

## 2024-06-26 DIAGNOSIS — F1994 Other psychoactive substance use, unspecified with psychoactive substance-induced mood disorder: Secondary | ICD-10-CM
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
## 2024-06-26 NOTE — Telephone Encounter (Signed)
 Faxed on 06/25/24

## 2024-07-01 ENCOUNTER — Other Ambulatory Visit: Payer: Self-pay | Admitting: Physician Assistant

## 2024-07-01 ENCOUNTER — Ambulatory Visit (INDEPENDENT_AMBULATORY_CARE_PROVIDER_SITE_OTHER): Payer: Self-pay | Admitting: Licensed Clinical Social Worker

## 2024-07-01 DIAGNOSIS — F1994 Other psychoactive substance use, unspecified with psychoactive substance-induced mood disorder: Secondary | ICD-10-CM

## 2024-07-01 DIAGNOSIS — G43809 Other migraine, not intractable, without status migrainosus: Secondary | ICD-10-CM

## 2024-07-01 DIAGNOSIS — F411 Generalized anxiety disorder: Secondary | ICD-10-CM

## 2024-07-01 DIAGNOSIS — Z79899 Other long term (current) drug therapy: Secondary | ICD-10-CM

## 2024-07-01 DIAGNOSIS — F1020 Alcohol dependence, uncomplicated: Secondary | ICD-10-CM

## 2024-07-01 NOTE — Progress Notes (Signed)
 Daily Group Progress Note   Program: CD IOP     Group Time: 9 a.m. to 12 p.m.      Type of Therapy: Process and Psychoeducational    Topic: The therapists check in with group members, assess for SI/HI/psychosis and overall level of functioning. The therapists inquire about sobriety date and number of community support meetings attended since last session.   The therapists introduce a new group member having group members add their story of how they came to be in IOP to group check-in. The therapists explain that a relapse is an opportunity for learning and question how it is that the disease of addiction can trick people in to doing the same thing over and over thinking things will be different the next time. The therapists point out that their disease wants them to focus on a moment in time; however, it is important to play the story forward. The therapists normalize boredom in early recovery as a result of PAWS and discuss how exercise can help to reduce the length and intensity of PAWS.  The therapists also explain in people cannot have healthy friendships or healthy relationships with significant others when one are both parties are in active addiction.  In such a relationship, the person who is an active addiction's primary relationship is the chemical or chemicals that they are abusing.  The therapists pointed out the fact that people in the 12-step program are not perfect but as a group are far more dependable and trustworthy than those who are in active addiction.The therapists suggest that as people progress in recovery that they will learn how to deal with interpersonal conflict more assertively such that they will not just drop out of the program and go back to using substances.  The therapists talk about what it means to be an adult child of an alcoholic or addict.  Lastly, the therapist discuss how people in recovery sometimes expect family members to put things in the past the moment  they enter recovery; however, this fails to recognize that trust will take time to restore and that remaining sober is a prerequisite for restoring this trust.     Summary: Tracy Lambert presents today rating her depression as a 2 and her anxiety as a 5.  She says that she has not drunk alcohol  since August 5.  She attended a meeting on Thursday having not attended any meetings since saying that she has been lazy and needs to start attending more.  She does not have a sponsor noting that most of the meetings that she attends have a majority of female attendees.  He says that she needs to find a women's meeting if she is more likely to find a sponsor.  Even though she identifies as being heterosexual, she says that she enjoys attending a LGBT 12-step meeting because the people there are open and she does not view herself as being religious.  Tracy Lambert describes herself as being a relapse Queen.  She says that she was drinking and laying out of work that her Production designer, theatre/television/film came to her house and discovered that [place] was home drinking.  When she came back to work, she was brought in to a meeting with the Furniture conservator/restorer of HR and thought that she was going to lose her job but was given this possibly 1 last chance.  She says that if she were to lose her job besides having no income that both she and her 30 year old son  would lose their health coverage.  In terms of drinking, describes her biggest guilt to be in relation to her son.  She says that she will usually send her son to his father's house when she is on a drinking binge.  Another group member makes the observation that [name] has a lot riding right now on drinking.  When the therapist inquires as to what the positive is in regards to drinking, she says that it provides her temporary escape from feeling any stress or anxiety at all.  The therapist observes that the price of having these short term, chemically induced breaks from anxiety is making it  ever more difficult for her to balance the equation justified continued use.  [name] describes her mood as anxious and overwhelmed.  Progress Towards Goals: [name] reports no change in her sobriety date.   UDS collected: Yes Results: No   AA/NA attended?:  Yes   Sponsor?:  No     [name], MA, [place], [facility], LCAS Darice Simpler, [place], LCAS 07/01/2024
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
## 2024-07-03 ENCOUNTER — Ambulatory Visit (INDEPENDENT_AMBULATORY_CARE_PROVIDER_SITE_OTHER): Payer: Self-pay

## 2024-07-03 VITALS — BP 120/70 | HR 69 | Ht 67.0 in | Wt 180.0 lb

## 2024-07-03 DIAGNOSIS — G43809 Other migraine, not intractable, without status migrainosus: Secondary | ICD-10-CM

## 2024-07-03 DIAGNOSIS — Z811 Family history of alcohol abuse and dependence: Secondary | ICD-10-CM

## 2024-07-03 DIAGNOSIS — F418 Other specified anxiety disorders: Secondary | ICD-10-CM

## 2024-07-03 DIAGNOSIS — G4700 Insomnia, unspecified: Secondary | ICD-10-CM

## 2024-07-03 DIAGNOSIS — F1020 Alcohol dependence, uncomplicated: Secondary | ICD-10-CM | POA: Diagnosis not present

## 2024-07-03 DIAGNOSIS — Z79899 Other long term (current) drug therapy: Secondary | ICD-10-CM

## 2024-07-03 DIAGNOSIS — F1994 Other psychoactive substance use, unspecified with psychoactive substance-induced mood disorder: Secondary | ICD-10-CM

## 2024-07-03 DIAGNOSIS — F3281 Premenstrual dysphoric disorder: Secondary | ICD-10-CM

## 2024-07-03 DIAGNOSIS — F4312 Post-traumatic stress disorder, chronic: Secondary | ICD-10-CM

## 2024-07-03 NOTE — Progress Notes (Signed)
 Daily Group Progress Note   Program: CD IOP     Group Time: 9 a.m. to 12 p.m.      Type of Therapy: Process and Psychoeducational    Topic: The therapists check in with group members, assess for SI/HI/psychosis and overall level of functioning. The therapists inquire about sobriety date and number of community support meetings attended since last session.   The therapists show the first part of the video of Breaking the Addiction Cycle with Velia Norfolk,  She discusses the addiction system, starting with a belief, followed by impaired thinking and unmanageability emphasizing that  the cycle continues unless the belief system of what an alcoholic/addict is changes.  Therapists asks group members to identify their beliefs, impaired thinking errors (denial) and unmanagebility.    Summary: Lariyah presents today rating her depression as a 3 and her anxiety as a 5. She reports her sobriety as 06-30-24 as this is when she took her last Xanax .  She says she has attended a meeting but does not have a sponsor. She identifies her emotions as calm, overwhelmed and anxious.   Kaneisha shares that her belief about an alcoholic is an alcoholic would be a [place] old man  She says her belief about what an addict would be someone who is young and female. Sama shares her family belief about alcoholism  and drug addiction was that it is something that is hidden. She says her father used to grow marijuana in the closets . Marijuana was seen as ok but alcohol  was seen to be a kept a secret. Her family communicate the message that it was ok for them to participate in use but not ok for the children.  Dondrea identifies her impaired thinking as she could not have been an alcoholic/addict because she has had a successful career. She also shares that the unmanageability part of the addiction cycle was her not liking herself. Therapist points out that it would seem when she almost lost her job and her supervisor saying he  may have been enabling her.  Progress Towards Goals: Allesandra reports no change in her sobriety date.   UDS collected: No Results: No   AA/NA attended?:  Yes   Sponsor?:  No   Darice Simpler, MS, LMFT, LCAS  [address], [place], [place], [facility], LCAS  07/03/2024

## 2024-07-04 LAB — TOXICOLOGY SCREEN, URINE: Creatinine, POC: 88 mg/dL

## 2024-07-05 ENCOUNTER — Ambulatory Visit (INDEPENDENT_AMBULATORY_CARE_PROVIDER_SITE_OTHER): Payer: Self-pay | Admitting: Licensed Clinical Social Worker

## 2024-07-05 DIAGNOSIS — F4312 Post-traumatic stress disorder, chronic: Secondary | ICD-10-CM

## 2024-07-05 DIAGNOSIS — Z79899 Other long term (current) drug therapy: Secondary | ICD-10-CM

## 2024-07-05 DIAGNOSIS — F1020 Alcohol dependence, uncomplicated: Secondary | ICD-10-CM | POA: Diagnosis not present

## 2024-07-05 DIAGNOSIS — F418 Other specified anxiety disorders: Secondary | ICD-10-CM

## 2024-07-05 NOTE — Progress Notes (Signed)
 Daily Group Progress Note   Program: CD IOP     Group Time: 9 a.m. to 12 p.m.      Type of Therapy: Process and Psychoeducational    Topic: The therapists check in with group members, assess for SI/HI/psychosis and overall level of functioning. The therapists inquire about sobriety date and number of community support meetings attended since last session.   The therapists present the 2nd half of the video on Breaking the Addiction Cycle emphasizing that people in recovery can do nothing about preoccupation; however, they are able to change their behavior via understanding and then interrupting their using ritual. The therapists give group members the homework of being prepared to detail their using rituals on Monday.  The therapists address the issue of recovery drift and how to be aware of it and to avoid it.  The therapists encouraged group members to prioritize their recovery and not say that they will try to get to a meeting but plan the meetings that they will be attending.  The therapists address the topic of emotions and encounter belief that there are such a thing as bad emotions are unhealthy emotions.  The therapist explain that all emotions are useful and serve as indicators in helping people to navigate life circumstances.   Summary: Tracy Lambert presents today rating her depression as a 3 and her anxiety is a 6.  She did not attend a meeting on Wednesday noting that the day was very intense as she had IOP and then met with her individual therapist.  She says that her individual therapist does want to talk with the IOP therapists and Alyce signed an ROI.  She says that she did not attend a meeting yesterday as she worked from 8 AM to 7:30 PM.  As a result of work stress, she told her work partner that she was considering attending half an IOP today but then realized that this was not the right decision. She says that she is going to attend a meeting on Saturday.  She describes her mood  as overwhelmed and stressed.    The therapist makes the observation that Tracy Lambert is repeatedly telling herself that she lacks coping skills is causing her to believe something that is not necessarily true.  Tracy Lambert admits that she has been prone to drink in response to anxiety as a result of lacking faith in her ability to deal with it and that she has viewed anxiety as being a bad emotion.  She is able to recognize that she will have to start being able to sit with some unpleasant emotions and does realize that she has made some progress in relation to how she deals with anger.  In the past, she says that she would become angry and curse at a coworker but now is able to take time and calm down for a couple of hours and discussed what she is angry about rationally.  Tracy Lambert says that she got a lot out of the information on the brain that the PA-C shared with her and sees that she has a brain injury that wants to focus on taking care of of her brain.  By the conclusion of group, she says that she had never previously talked about her using ritual but is now more aware of it.  The therapist also addresses her frequent statement about being a chronic relapser encouraging her to stop indoctrinate and herself with this belief.   Progress Towards Goals: Tracy Lambert reports no change in her  sobriety date.   UDS collected: No Results: Yes, positive for alprazolam    AA/NA attended?:  No   Sponsor?:  No     [name], MA, [place], [facility], LCAS Darice Simpler, [place], LCAS 07/05/2024

## 2024-07-08 ENCOUNTER — Ambulatory Visit (INDEPENDENT_AMBULATORY_CARE_PROVIDER_SITE_OTHER): Payer: Self-pay | Admitting: Licensed Clinical Social Worker

## 2024-07-08 DIAGNOSIS — F418 Other specified anxiety disorders: Secondary | ICD-10-CM

## 2024-07-08 DIAGNOSIS — F1020 Alcohol dependence, uncomplicated: Secondary | ICD-10-CM | POA: Diagnosis not present

## 2024-07-08 DIAGNOSIS — Z79899 Other long term (current) drug therapy: Secondary | ICD-10-CM

## 2024-07-08 DIAGNOSIS — F4312 Post-traumatic stress disorder, chronic: Secondary | ICD-10-CM

## 2024-07-08 NOTE — Progress Notes (Signed)
 Daily Group Progress Note   Program: CD IOP     Group Time: 9 a.m. to 12 p.m.      Type of Therapy: Process and Psychoeducational    Topic: The therapist checks in with group members, assesses for SI/HI/psychosis and overall level of functioning. The therapist inquires about sobriety date and number of community support meetings attended since last session.   The therapist has group members detail their using rituals while discussing ways to interrupt these rituals. The therapist explains to group members that if they wait until they are no longer scared before asking someone to be their Sponsor, then they will never end up asking anyone. The therapist suggests that they need not worry if the water is hot or cold but must just dive right in.    Summary: Tracy Lambert presents today rating both her depression and anxiety as a 3. Tracy Lambert shares her using ritual on days when she knew she did not have to work the next day. She says that she would be relaxed at the end of the day. She used to turn off her Life 360 so her son could not detect she had gone to the [facility] store but as he complained about this, she would got to the store and pick up groceries and secretly buy wine.  She would hide the wine with dog food in the car before picking him up and would eventually sneak it into her room hiding it in an ottoman at which point she would relax knowing she got away with it.   After cooking dinner and getting everything done for the evening, she would drink the $5 chardonnays she had purchased. If she were drinking liquor, she would buy only the blue Smirnoff going to two different [employer] stores one near her work and one near her home. She admits to stealing alcohol  on Sundays when unable to buy it.   Presently, Tracy Lambert has changed her wake-up time to 6 am. knowing this would not give her time to drink. During group, another step she realizes that she needs to take is to delete her Instacart. She says that her  therapist has encouraged her to stop hanging out in her bedroom and only go there to sleep staying in the living room instead as her room was where she drank and escaped. This therapist suggests that she could put some recovery-related reminders in her room.   Tracy Lambert says that she has 20 days sober from alcohol  and a week off Xanax . She says that she feels good and feels strong. She continues to not have a Sponsor saying that she has a few in mind but has not acted as she has an issue with not wanting to be a burden and possibly trust issues with women given her terrible relationship with her mother.    Progress Towards Goals: Tracy Lambert reports no change in her sobriety date.   UDS collected: Yes Results: No   AA/NA attended?:  Yes   Sponsor?:  No     [name], MA, [place], [facility], LCAS Darice Simpler, [place], LCAS 07/08/2024

## 2024-07-10 ENCOUNTER — Telehealth (HOSPITAL_COMMUNITY): Payer: Self-pay | Admitting: Licensed Clinical Social Worker

## 2024-07-10 ENCOUNTER — Ambulatory Visit (HOSPITAL_COMMUNITY): Payer: Self-pay

## 2024-07-10 LAB — TOXICOLOGY SCREEN, URINE: Creatinine, POC: 68 mg/dL

## 2024-07-10 NOTE — Telephone Encounter (Signed)
 The therapist received the following email from [place]:  Tracy Lambert. Good morning. Just emailing you to let you know that I'm sick and won't be in today. I've been feeling it coming. I'm off from work so I'm just going to rest. Today I have 22 days!   See you Friday...  Kerri Lambert Maier, MA, LCSW, [facility], LCAS 07/10/2024

## 2024-07-12 ENCOUNTER — Encounter (HOSPITAL_COMMUNITY): Payer: Self-pay | Admitting: Medical

## 2024-07-12 ENCOUNTER — Ambulatory Visit (INDEPENDENT_AMBULATORY_CARE_PROVIDER_SITE_OTHER): Payer: Self-pay | Admitting: Medical

## 2024-07-12 DIAGNOSIS — Z79899 Other long term (current) drug therapy: Secondary | ICD-10-CM

## 2024-07-12 DIAGNOSIS — F1994 Other psychoactive substance use, unspecified with psychoactive substance-induced mood disorder: Secondary | ICD-10-CM

## 2024-07-12 DIAGNOSIS — G43809 Other migraine, not intractable, without status migrainosus: Secondary | ICD-10-CM

## 2024-07-12 DIAGNOSIS — F39 Unspecified mood [affective] disorder: Secondary | ICD-10-CM

## 2024-07-12 DIAGNOSIS — F4312 Post-traumatic stress disorder, chronic: Secondary | ICD-10-CM

## 2024-07-12 DIAGNOSIS — F1020 Alcohol dependence, uncomplicated: Secondary | ICD-10-CM | POA: Diagnosis not present

## 2024-07-12 DIAGNOSIS — G4700 Insomnia, unspecified: Secondary | ICD-10-CM

## 2024-07-12 DIAGNOSIS — F418 Other specified anxiety disorders: Secondary | ICD-10-CM

## 2024-07-12 DIAGNOSIS — Z811 Family history of alcohol abuse and dependence: Secondary | ICD-10-CM

## 2024-07-12 DIAGNOSIS — F411 Generalized anxiety disorder: Secondary | ICD-10-CM

## 2024-07-12 NOTE — Progress Notes (Signed)
 Daily Group Progress Note   Program: CD IOP     Group Time: 9 a.m. to 12 p.m.      Type of Therapy: Process and Psychoeducational    Topic: The therapist checks in with group members, assesses for SI/HI/psychosis and overall level of functioning. The therapist inquires about sobriety date and number of community support meetings attended since last session.   The therapist shows two videos aimed at illustrating that using any addictive substance impedes the return of one's dopamine receptors and shatters the myth of soft versus hard drugs. The therapist facilitates a discussion on this as well as spirituality as it relates to recovery.    Summary: Tracy Lambert presents today rating her depression as a 2 and her anxiety as a 4. She informs this therapist that she will not be in group on Wednesday as she has an appointment with her PCP.  When another group member is challenged about having one sobriety date when he identifies one date for pot and another for alcohol , Tracy Lambert indicates that she does not agree that he has only one date which becomes the focus of today's session. By session's end, she is able to understand why he has only one sobriety date and understands further why she tool has one sobriety date with hit being the more recent date that she last used Xanax .  She says that her mood is good and that work is fine. She says that she is learning how to handle stress. She says that she got into an argument with a friend who is a co-worker admitting that anger is a big trigger for her; however, she did not drink and worked through the situation effectively.  Tracy Lambert says that she has been worrying about finances living paycheck to paycheck; however, states her belief that she has been put in this situation for a reason to learn something.   She went to a meeting and her former Sponsor did not raise her hand and the one woman who did shared some bizarre things leading Tracy Lambert to not want her as her  Sponsor. She pulls a piece of paper given to her by a woman with the woman's phone number. Tracy Lambert says that the woman gave her her name and number telling her to call her as she told Tracy Lambert that she used to be her. Tracy Lambert has heard the woman share having no concerns about her so is going to call her about possibly being her Sponsor.  Tracy Lambert says that she likes the focus on science in IOP and the realization that she must focus on getting a year sober and allow her brain to recover.   Progress Towards Goals: Tracy Lambert reports no change in her sobriety date.   UDS collected: No Results: Yes, negative for drugs and alcohol    AA/NA attended?:  Yes   Sponsor?:  Yes     Tracy Maier, MA, LCSW, [facility], LCAS 07/12/2024

## 2024-07-12 NOTE — Progress Notes (Signed)
 [place] since 2017 24 days 14 days
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
## 2024-07-17 ENCOUNTER — Ambulatory Visit (HOSPITAL_COMMUNITY): Payer: Self-pay

## 2024-07-17 ENCOUNTER — Encounter (HOSPITAL_COMMUNITY): Payer: Self-pay

## 2024-07-19 ENCOUNTER — Ambulatory Visit (HOSPITAL_COMMUNITY): Payer: Self-pay

## 2024-07-19 ENCOUNTER — Telehealth (HOSPITAL_COMMUNITY): Payer: Self-pay | Admitting: Licensed Clinical Social Worker

## 2024-07-19 NOTE — Telephone Encounter (Signed)
 The therapist receives the following email from [place]:  Good morning. Woke up with a horrible migraine this morning. I've taken some medicine that usually helps but I'm going to stay home. When it's this bad it can be hard to drive.   Looking forward to starting fresh Monday. Picked up my 30 day chip last night!   Kerri Zell Maier, MA, LCSW, [facility], LCAS 07/19/2024

## 2024-07-22 ENCOUNTER — Ambulatory Visit (INDEPENDENT_AMBULATORY_CARE_PROVIDER_SITE_OTHER): Payer: Self-pay

## 2024-07-22 DIAGNOSIS — F1020 Alcohol dependence, uncomplicated: Secondary | ICD-10-CM

## 2024-07-22 DIAGNOSIS — F4312 Post-traumatic stress disorder, chronic: Secondary | ICD-10-CM

## 2024-07-22 DIAGNOSIS — F418 Other specified anxiety disorders: Secondary | ICD-10-CM

## 2024-07-22 NOTE — Progress Notes (Signed)
 Daily Group Progress Note   Program: CD IOP     Group Time: 9 a.m. to 12 p.m.      Type of Therapy: Process and Psychoeducational    Topic: The therapist checks in with group members, assesses for SI/HI/psychosis and overall level of functioning. The therapist inquires about sobriety date and number of community support meetings attended since last session.  The therapists introduce the new group member and the person visiting for today. The therapists discuss addiction as a brain disease, and specially discuss the role of dopamine as being implicated when people who suffer from addiction feel bored, nothing there dopamine receptors have dampen down and explain it takes about 12 months for the brains dopamine receptors to re-regulate.  Therapist show Dr. Elspeth Melemis video, Relapse Prevention. Dr. Kyle discusses the key to relapse prevention is to understand relapse happens gradually and builds momentum. Melemis notes the first stage of relapse is emotional which involves a person not thinking about using and being in denial. He note that poor self care is the essence of emotional relapse.  He then notes if one stays in this stage for too long, it transitions to the mental stage of relapse where addicts feel uncomfortable in their own skin. In this stage part of the mind wants to use while the other part does not want to use.  This stage minimizes past consequences. As thoughts of using grow stronger if they are not interrupted, the relapse then becomes physical.    Summary: Tracy Lambert presents today rating both her depression and anxiety as a 2. She says she has 34 days of sobriety.  She says she went to a meeting on last Tuesday but has been attending online. She still does not have a sponsor. She says she has not yet picked up a 30 day chip.  Tracy Lambert shares that her manager has said she wants to go with her to pick up the chip. Tracy Lambert notes this seems strange since they are not close with one  another.  Tracy Lambert shares with the new folks that since 2017 she has been struggling with a lot of relapses. She says her life was becoming unmanageable. Tracy Lambert says she thinks her alcoholism is an obsession. She says when she does not drink she is bored. Tracy Lambert says she thinks the reason she continues to relapse is that she does not reach out to others, as she has always thought this is being a burden to others.  Progress Towards Goals: Tracy Lambert reports no change in her sobriety date.   UDS collected: Yes Results: No   AA/NA attended?:  Yes   Sponsor?:  No     [name], MA, [place], [facility], LCAS Darice Simpler, [place], LCAS 07/22/2024

## 2024-07-24 ENCOUNTER — Ambulatory Visit (INDEPENDENT_AMBULATORY_CARE_PROVIDER_SITE_OTHER): Payer: Self-pay | Admitting: Medical

## 2024-07-24 ENCOUNTER — Encounter (HOSPITAL_COMMUNITY): Payer: Self-pay | Admitting: Medical

## 2024-07-24 DIAGNOSIS — F1020 Alcohol dependence, uncomplicated: Secondary | ICD-10-CM

## 2024-07-24 DIAGNOSIS — F418 Other specified anxiety disorders: Secondary | ICD-10-CM

## 2024-07-24 DIAGNOSIS — Z79899 Other long term (current) drug therapy: Secondary | ICD-10-CM

## 2024-07-24 DIAGNOSIS — G43809 Other migraine, not intractable, without status migrainosus: Secondary | ICD-10-CM

## 2024-07-24 DIAGNOSIS — F411 Generalized anxiety disorder: Secondary | ICD-10-CM

## 2024-07-24 DIAGNOSIS — F4312 Post-traumatic stress disorder, chronic: Secondary | ICD-10-CM

## 2024-07-24 DIAGNOSIS — Z811 Family history of alcohol abuse and dependence: Secondary | ICD-10-CM

## 2024-07-24 DIAGNOSIS — F1994 Other psychoactive substance use, unspecified with psychoactive substance-induced mood disorder: Secondary | ICD-10-CM

## 2024-07-24 DIAGNOSIS — F39 Unspecified mood [affective] disorder: Secondary | ICD-10-CM

## 2024-07-24 DIAGNOSIS — G4700 Insomnia, unspecified: Secondary | ICD-10-CM

## 2024-07-24 NOTE — Addendum Note (Signed)
 Addended by: LEILA CARLIN BRAVO on: 07/24/2024 05:39 PM   Modules accepted: Level of Service

## 2024-07-24 NOTE — Progress Notes (Signed)
 Daily Group Progress Note   Program: CD IOP     Group Time: 9 a.m. to 12 p.m.      Type of Therapy: Process and Psychoeducational    Topic: The therapists check in with group members, assess for SI/HI/psychosis and overall level of functioning. The therapists inquire about sobriety date and number of community support meetings attended since last session.   The therapists introduce a new group member. The therapist explain from a biological and behavioral standpoint the reason that it is so difficult to stop using substances even when someone wants to do so.  The therapists emphasize that it is counterproductive to drown oneself and shame and guilt after a slip or relapse and that it is more productive to review this situation as an opportunity for learning and a way to develop a relapse prevention plan.  The therapists explain the importance of avoiding people who are in active addiction.  The therapist also caution against people trying to rescue loved ones who remain an active addiction pointing out that these people are more a danger to them than they are a help to them.  The therapists emphasize the importance of picking up the phone and calling one's sponsor or other people in recovery when feeling emotionally distressed even when not thinking of using.  The therapists make the observation that if a person begins to use a substance that here she does not necessarily have to continue to use adding that many persons with addiction conclude that they must continue using as a result of all or nothing thinking which is irrational.  The therapists discuss the risks associated with vaping informing group members that it is not a safe replacement for tobacco and that nicotine replacement such as the patch is preferable.     Summary: Tracy Lambert presents today rating her depression as a 3 and her anxiety as a 6.  She said she says that she has a migraine today and recently took her migraine medication so we  will hopefully feel better soon.  She adds that she has been getting a lot of migraines lately.  She says that yesterday was hard as she received an email from her boss who noted that she was coming in progressively later although Tracy Lambert points out that she is still coming in earlier than when she is supposed to be at work.  She says that her boss failed to note any of the positive things she has been doing but then adds that she has done this to herself.  As a result of this email, she says that she began to start feeling uncomfortable in her own skin which led to an alcohol  craving last night.  She says that when she is uncomfortable in her own skin that this is when she is most prone to use.  She asks to see the PA-C today to hopefully be put on baclofen.  In addition to the situation with her boss, she says that she received a huge bill that over drafted her account such that she only has about $100 left till payday.  She says that living from check to check is stressful; however, she is happy that she has not done what she would have done in the past and getting a high interest loan so as to feel less anxious and to be able to drink.  Tracy Lambert says that because she has been working more regularly and is not out drinking that she is going to get a  big paycheck this pay period.  She realizes that if she was able to make the money she previously was making while drink drinking that she will almost certainly make a lot more money over time staying sober.  Additionally, she spent some quality time with her son and says that it was clear that he was so happy which she believes is a direct result of her not drinking.  She describes her mood as stressed, anxious, and determined.  She has not been attending many meetings attributing this to not feeling well physically and her work schedule.  The therapist points out that she can supplement her in person meetings with some virtual meetings that she can  listen to while doing things at home such as cooking.  She does not have a sponsor as most of the meetings she does attend in person have way more men than women.  She says that she was hoping to get to a women's meeting and find a sponsor there; however, it becomes clear that she does not know when these meetings occur.  Thus, she is given the homework assignment of finding out when these meetings occur during the week week and furthermore planning which ones she will attend and on what days.   Progress Towards Goals: Tracy Lambert reports no change in her sobriety date.  UDS collected: No Results: No  AA/NA attended?:  No   Sponsor?:  No     [name], MA, [place], [facility], LCAS Darice Simpler, [place], LCAS 07/24/2024

## 2024-07-24 NOTE — Progress Notes (Addendum)
 Patient ID: Tracy Lambert, female   DOB: Apr 11, 1976, 48 y.o.   MRN: [id number]  Brief intervention  Detailed note to follow Pt r Questing Baclofen  for alcohol  cravings and Genesight study as she has been on multiple antidepressants without success Genesight started Baclofen  rx

## 2024-07-26 ENCOUNTER — Telehealth (HOSPITAL_COMMUNITY): Payer: Self-pay | Admitting: Licensed Clinical Social Worker

## 2024-07-26 ENCOUNTER — Ambulatory Visit (HOSPITAL_COMMUNITY): Payer: Self-pay

## 2024-07-26 NOTE — Telephone Encounter (Signed)
 The therapist receives the following email from [place]:  Good morning. I have a scheduling conflict and won't be able to make it today.sorry for the late notice. See you Monday!   Kerri Zell Maier, MA, LCSW, [facility], LCAS 07/26/2024

## 2024-07-29 ENCOUNTER — Ambulatory Visit (INDEPENDENT_AMBULATORY_CARE_PROVIDER_SITE_OTHER): Payer: Self-pay | Admitting: Licensed Clinical Social Worker

## 2024-07-29 ENCOUNTER — Other Ambulatory Visit (HOSPITAL_COMMUNITY): Payer: Self-pay | Admitting: Medical

## 2024-07-29 DIAGNOSIS — F1020 Alcohol dependence, uncomplicated: Secondary | ICD-10-CM

## 2024-07-29 DIAGNOSIS — Z79899 Other long term (current) drug therapy: Secondary | ICD-10-CM

## 2024-07-29 DIAGNOSIS — F4312 Post-traumatic stress disorder, chronic: Secondary | ICD-10-CM

## 2024-07-29 MED ORDER — BACLOFEN 10 MG PO TABS: 10.0000 mg | ORAL_TABLET | Freq: Three times a day (TID) | ORAL | 0 refills | Status: DC

## 2024-07-29 NOTE — Progress Notes (Signed)
 Daily Group Progress Note   Program: CD IOP     Group Time: 9 a.m. to 12 p.m.      Type of Therapy: Process and Psychoeducational    Topic: The therapists check in with group members, assess for SI/HI/psychosis and overall level of functioning. The therapists inquire about sobriety date and number of community support meetings attended since last session.   The therapists make the observation that group members who fail to attend IOP on a consistent basis and those who do not attend regular sober support meetings tend to relapse at a much higher rate than those who attend consistently.  The therapist frame a relapse or a slip as an opportunity for learning and suggest that people must move from being problem focused towards being solution focused.  The therapists explained that those in early recovery are not in a position to be trying to help others and must be selfish in their recovery.  The therapists talk about what is meant by the phrase easy does it and also why the program encourages people to not compare their insides to other people's outsides.  The main thing that is stressed today is the need for people in recovery to not isolate and tried to navigate life problems on their own but rather to reach out to others to obtain feedback and support.  The therapists provide a list of character defects, resentments, and fears for group members to review explaining that those who are on step 1 can use this material when they eventually get to working step 4.    Summary: Tracy Lambert presents today rating her depression as a 6 and her anxiety as a 7.  She admits that she is feeling let down, overwhelmed, bored, impatient,  andinferior.  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  Tracy Lambert adds that she feels lonely and vulnerable.  She admits that she relapsed  last Thursday and lied about not attending group due to a scheduling conflict.  She also admits that she lied about picking up her 30-day chip not knowing why she did so.  Prior to relapsing, she says that she had 38 days sober.  The therapists encouraged her to shift away from beating up on herself and being problem focused to coming up with an action plan as to what she needs to change.  Tracy Lambert recognizes that she needs to start attending meetings regularly and stop isolating.  Additionally, she needs to get a sponsor and needs to call and check in with someone on a daily basis such that she is not trying to navigate life by herself.  Tracy Lambert says that she is aware of the reason that she has such difficulty reaching out to others saying that it stems from her relationship with her mother.  She says that she had not spoken to her mother in years and that when she did finally reconnect with her rather than asking what was going on in [place] her mother was more focused in talking about some new shoes that she had gotten recently.  Tracy Lambert receives a lot of support and encouragement from other group members.  She says that she asked the PA-C about baclofen   last week but had not heard back from him about this so the therapist messages him about her request to start this medication.  At the conclusion of the  session, she recognizes the need to stop comparing herself to others.   Progress Towards Goals: Tracy Lambert admits to relapsing last Thursday.  UDS collected: Yes Results: No  AA/NA attended?:  No   Sponsor?:  No    Tracy Maier, MA, [place], [facility], LCAS Tracy Lambert, [place], LCAS 07/29/2024

## 2024-07-31 ENCOUNTER — Ambulatory Visit (INDEPENDENT_AMBULATORY_CARE_PROVIDER_SITE_OTHER): Payer: Self-pay

## 2024-07-31 ENCOUNTER — Ambulatory Visit: Admitting: Physician Assistant

## 2024-07-31 DIAGNOSIS — F1020 Alcohol dependence, uncomplicated: Secondary | ICD-10-CM | POA: Diagnosis not present

## 2024-07-31 DIAGNOSIS — Z91199 Patient's noncompliance with other medical treatment and regimen due to unspecified reason: Secondary | ICD-10-CM

## 2024-07-31 DIAGNOSIS — F1994 Other psychoactive substance use, unspecified with psychoactive substance-induced mood disorder: Secondary | ICD-10-CM

## 2024-07-31 DIAGNOSIS — F4312 Post-traumatic stress disorder, chronic: Secondary | ICD-10-CM

## 2024-07-31 NOTE — Progress Notes (Addendum)
 Daily Group Progress Note   Program: [facility]     Group Time: 9 a.m. to 12 p.m.      Type of Therapy: Process and Psychoeducational    Topic: The therapists check in with group members, assess for SI/HI/psychosis and overall level of functioning. The therapists inquire about sobriety date and number of community support meetings attended since last session.   The therapists share the 11 steps of relapse as presented by [facility]. These 11 steps are as follows:  1. Unhealthy Emotions, 2. Denial, 3. Compulsive Behaviors, 4. Triggers, 5. [employer], 6.External Chaos, 7. Loss of Control, 8. Addictive Thinking, 9. High Risk Situations, 10, Relapse (physical), 11. Aftermath of relapse. Therapists compare how the 11 steps are details of the 3 steps of relapse which Elspeth Railing, PhD which include emotional, mental and physical. Therapists asks group members to identify where they see themselves in this framework and discuss it.   Summary: Ajiah presents today rating her depression as a 3 and her anxiety as a 5.   Aven reports she has attended meetings since last group. She reports she now has a sponsor.  Ciana says she texted with her sponsor this a.m.  Shavonn says she can relate to the 11 steps, particularly to the Addictive thinking. She says she finds herself getting in the f you thinking.  Ronneisha reports prior to coming to [facility] she could not look herself in the mirror, but now she is engaging in better self care now that she is pursuing recovery.  Camy discusses staying away from triggers and notes she has to decide what is important to her.  She discusses how she is making the choice for recovery, rather than attending company parties. She notes she finds it strange that her supervisor would want to accompany her to an AA meeting to pick up her chip while this person is most likely in addictive addiction. Bailey says she attended a virtual meeting but was afraid her  supervisor could have been in on the meeting so she did not share about her slip.  She says she plans to go to an in person meeting tonight to pick up a white chip.  Harmoni says her take away for the day is she feels empowered.   Progress Towards Goals: Sober date of 07-26-24  UDS collected: No Results: None  AA/NA attended?:  yes   Sponsor?:  yes  Darice Simpler, LMFT, LCAS  [address] [place], [place], [facility], LCAS  07/31/2024

## 2024-08-02 ENCOUNTER — Ambulatory Visit (INDEPENDENT_AMBULATORY_CARE_PROVIDER_SITE_OTHER): Payer: Self-pay | Admitting: Licensed Clinical Social Worker

## 2024-08-02 DIAGNOSIS — Z79899 Other long term (current) drug therapy: Secondary | ICD-10-CM

## 2024-08-02 DIAGNOSIS — F4312 Post-traumatic stress disorder, chronic: Secondary | ICD-10-CM

## 2024-08-02 DIAGNOSIS — F1020 Alcohol dependence, uncomplicated: Secondary | ICD-10-CM

## 2024-08-02 DIAGNOSIS — F1994 Other psychoactive substance use, unspecified with psychoactive substance-induced mood disorder: Secondary | ICD-10-CM

## 2024-08-02 LAB — TOXICOLOGY SCREEN, URINE: Creatinine, POC: 151 mg/dL

## 2024-08-05 ENCOUNTER — Telehealth (HOSPITAL_COMMUNITY): Payer: Self-pay | Admitting: Licensed Clinical Social Worker

## 2024-08-05 ENCOUNTER — Ambulatory Visit (HOSPITAL_COMMUNITY): Payer: Self-pay

## 2024-08-05 NOTE — Telephone Encounter (Signed)
 The therapist receives the following email from [place]:  I'm sorry I'm just now getting this to you. Woke up in the middle of the night with one of my migraines. Finally fell back to sleep and totally overslept. Still not feeling great anyway.   See you Wednesday!   [address] [place], MA, LCSW, [facility], LCAS 08/05/2024
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
## 2024-08-07 ENCOUNTER — Telehealth (HOSPITAL_COMMUNITY): Payer: Self-pay | Admitting: Licensed Clinical Social Worker

## 2024-08-07 ENCOUNTER — Ambulatory Visit (HOSPITAL_COMMUNITY): Payer: Self-pay

## 2024-08-07 NOTE — Telephone Encounter (Signed)
 The therapist attempts to reach [facility] when she does not show for group leaving a HIPAA-compliant voicemail.  Zell Maier, MA, LCSW, [facility] LCAS 08/07/2024

## 2024-08-08 ENCOUNTER — Telehealth (HOSPITAL_COMMUNITY): Payer: Self-pay | Admitting: Licensed Clinical Social Worker

## 2024-08-08 NOTE — Telephone Encounter (Signed)
 The therapist receives the following email from [place]:  [employer]. Just listened to your vm. It made me smile and laugh a little.  I'm ok but probably exactly where you think I am. I want to come back this Friday if that's ok.  The therapist responds with a completely new email to [email or web address] with the following:   Good morning,  In answer to your question, absolutely! We will see you Friday. Keep coming back ??.   Zell Maier, MA, LCSW, [facility], LCAS 08/08/2024

## 2024-08-09 ENCOUNTER — Ambulatory Visit (HOSPITAL_COMMUNITY): Payer: Self-pay

## 2024-08-09 ENCOUNTER — Telehealth (HOSPITAL_COMMUNITY): Payer: Self-pay | Admitting: Licensed Clinical Social Worker

## 2024-08-09 NOTE — Telephone Encounter (Signed)
 The therapist attempt to reach [facility] leaving a HIPAA-compliant voicemail.   Zell Maier, MA, LCSW, [facility], LCAS 08/09/2024

## 2024-08-09 NOTE — Telephone Encounter (Signed)
 The therapist receives the following email from [place]:  I think I need a one in one with you. Is that possible,  The therapist sends a new email with the following:  Could you come see me on Tuesday, September 30th, at [address] in [address] on the 3rd floor of the [employer] as this is where I am on Tuesdays and Thursdays?  Thanks,  Zannah responds with:   Yes.   I don't know what is wrong with me except the only thing that makes me feel better drinking.   You are a fabulous therapist very smart anything I'm glad we met.  The therapist sends a new email with the following:   Thanks!  I will see you then. In the meantime, get to a meeting. ??  Have a good weekend,   [place], [place], [place], [facility], LCAS 08/09/2024

## 2024-08-13 ENCOUNTER — Telehealth (HOSPITAL_COMMUNITY): Payer: Self-pay | Admitting: Licensed Clinical Social Worker

## 2024-08-13 ENCOUNTER — Encounter (HOSPITAL_COMMUNITY): Payer: Self-pay | Admitting: Licensed Clinical Social Worker

## 2024-08-13 ENCOUNTER — Ambulatory Visit (HOSPITAL_COMMUNITY): Admitting: Licensed Clinical Social Worker

## 2024-08-13 NOTE — Telephone Encounter (Signed)
 The therapist receives the following email from [place]:  I haven't drank since last night around 7. I'm not sure if I'm ok to drive. Just feeling really weak today. Going to a meeting tonight. I'll for sure see you tomorrow.   My work needs paperwork from you about going in full time fmla.   Is that something you can  help me with? They need it asap.   Kerri   The therapist receives another email with the following:  Here's her information. Let me know if you have any questions. Thanks!   [email or web address]    The therapist sends [place] correspondence via MyChart.  Zell Maier, MA, LCSW, [facility], LCAS 08/13/2024

## 2024-08-13 NOTE — Telephone Encounter (Signed)
 The therapist receives the following email from [place]:  Thank you. See you tomorrow am.   I am so scared I don't know how I will get through this and be better. I'm terrified   Zell Maier, [place], LCSW, [facility], LCAS 08/13/2024

## 2024-08-14 ENCOUNTER — Ambulatory Visit (INDEPENDENT_AMBULATORY_CARE_PROVIDER_SITE_OTHER): Payer: Self-pay | Admitting: Licensed Clinical Social Worker

## 2024-08-14 DIAGNOSIS — Z79899 Other long term (current) drug therapy: Secondary | ICD-10-CM

## 2024-08-14 DIAGNOSIS — F4312 Post-traumatic stress disorder, chronic: Secondary | ICD-10-CM

## 2024-08-14 DIAGNOSIS — F1994 Other psychoactive substance use, unspecified with psychoactive substance-induced mood disorder: Secondary | ICD-10-CM

## 2024-08-14 DIAGNOSIS — F1020 Alcohol dependence, uncomplicated: Secondary | ICD-10-CM
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
## 2024-08-15 ENCOUNTER — Telehealth (HOSPITAL_COMMUNITY): Payer: Self-pay | Admitting: Licensed Clinical Social Worker

## 2024-08-15 ENCOUNTER — Encounter: Payer: Self-pay | Admitting: Medical

## 2024-08-15 NOTE — Telephone Encounter (Signed)
 The therapist receives the following email from [place]:  Tracy Lambert. Good morning! Can you resend the paperwork? She said she did not get it. I know we emailed it but if it's easier she just gave me her fax number.   [phone] Attn: Tracy Lambert.   Let me know if you have any questions.   Thanks so much!   Kerri    The therapist calls Tracy Lambert confirming her identity via two identifiers. The therapist inquires as to what paperwork to which she is referring. She though that the therapist had email the letter he provided to her; however, he reminds her that he gave her the letter to give to HR. Tracy Lambert says that she will scan it and email it today and thanks the therapist for his assistance. She says that she was able to get sleep last night.   Lambert Maier, MA, LCSW, [facility], LCAS 08/15/2024

## 2024-08-16 ENCOUNTER — Ambulatory Visit (INDEPENDENT_AMBULATORY_CARE_PROVIDER_SITE_OTHER): Payer: Self-pay | Admitting: Licensed Clinical Social Worker

## 2024-08-16 DIAGNOSIS — F1994 Other psychoactive substance use, unspecified with psychoactive substance-induced mood disorder: Secondary | ICD-10-CM

## 2024-08-16 DIAGNOSIS — Z79899 Other long term (current) drug therapy: Secondary | ICD-10-CM

## 2024-08-16 DIAGNOSIS — F4312 Post-traumatic stress disorder, chronic: Secondary | ICD-10-CM

## 2024-08-16 DIAGNOSIS — F1020 Alcohol dependence, uncomplicated: Secondary | ICD-10-CM | POA: Diagnosis not present

## 2024-08-16 NOTE — Progress Notes (Signed)
 Daily Group Progress Note   Program: [facility]     Group Time: 9 a.m. to 12 p.m.      Type of Therapy: Process and Psychoeducational    Topic: The therapist checks in with group members, assesses for SI/HI/psychosis and overall level of functioning. The therapist inquires about sobriety date and number of community support meetings attended since last session.   The therapist facilitates group discussions on a number of topics such as self will and the need to prioritize one's addiction recovery given that this disease is one that can prove to be fatal for many.  The therapist talks about the importance of scheduling and it deserves that when the person uses the word try that here she is typically not likely to end up following through.  The therapist talks about the fact that using drugs and alcohol  when a person is wait-and-see age or an adolescent will hardwire this pattern in the person's brain indefinitely with stress being a factor that can activate it even though it has been dormant for some time.  The therapist makes the observation that sometimes what an individual may be going through could end up being an inspiration to others.  Lastly, the therapist talks about the history of Alcoholics Anonymous and Narcotics Anonymous explaining that everything they do in the program has a reason and that people must trust that there is a method to their madness.   Summary: Tracy Lambert presents today rating her depression as a 5 and her anxiety as a 6.  She reports no change in her sobriety date.  Now that she is not working, he says that she will be able to go to the women's meeting on Saturday where she can hopefully find a new sponsor.  She describes her mood as being pretty good, unmoored, nervous, and free.  She says that this is the first time that she has not worked in 17 years but at the same time recognizes that her work history and skills opens up a a lot of possibilities for her in terms of  new jobs.  Tracy Lambert provides support to a another group member letting him know that she has viewed him as being one of the strongest group members and that he has maintained his sobriety in the face of extreme adversity.  By the conclusion of today's group, she says that her biggest take away is that she is going to need to attend a lot of meetings as she leads a new life in which she will need a new community as she will no longer be seeing some of her friends who are her coworkers of last 17 years.    Progress Towards Goals: Tracy Lambert reports no change in her sobriety date.   UDS collected: Yes Results: No   AA/NA attended?:  Yes   Sponsor?:  No     [name], [place], [place], [facility], LCAS 08/16/2024

## 2024-08-19 ENCOUNTER — Ambulatory Visit (INDEPENDENT_AMBULATORY_CARE_PROVIDER_SITE_OTHER): Payer: Self-pay | Admitting: Licensed Clinical Social Worker

## 2024-08-19 DIAGNOSIS — F1994 Other psychoactive substance use, unspecified with psychoactive substance-induced mood disorder: Secondary | ICD-10-CM

## 2024-08-19 DIAGNOSIS — F411 Generalized anxiety disorder: Secondary | ICD-10-CM

## 2024-08-19 DIAGNOSIS — Z811 Family history of alcohol abuse and dependence: Secondary | ICD-10-CM

## 2024-08-19 DIAGNOSIS — F1020 Alcohol dependence, uncomplicated: Secondary | ICD-10-CM

## 2024-08-19 DIAGNOSIS — Z79899 Other long term (current) drug therapy: Secondary | ICD-10-CM

## 2024-08-19 DIAGNOSIS — G4700 Insomnia, unspecified: Secondary | ICD-10-CM

## 2024-08-19 DIAGNOSIS — F39 Unspecified mood [affective] disorder: Secondary | ICD-10-CM

## 2024-08-19 DIAGNOSIS — G43809 Other migraine, not intractable, without status migrainosus: Secondary | ICD-10-CM

## 2024-08-19 DIAGNOSIS — F418 Other specified anxiety disorders: Secondary | ICD-10-CM

## 2024-08-19 DIAGNOSIS — F4312 Post-traumatic stress disorder, chronic: Secondary | ICD-10-CM

## 2024-08-19 NOTE — Progress Notes (Signed)
 Daily Group Progress Note   Program: [facility]     Group Time: 9 a.m. to 12 p.m.      Type of Therapy: Process and Psychoeducational    Topic: The therapists check in with group members, assess for SI/HI/psychosis and overall level of functioning. The therapists inquire about sobriety date and number of community support meetings attended since last session.   The therapists introduce a new group member and read the NA Just for Today reading from 08/17/24 on the [employer] asking group members to discuss what their takeaway is from this and the biological reason that people would be bored in early recovery. The therapists suggest that comparing one's self to others or trying to be special or unique are both products of ego and likely to end only in misery. The therapists suggest that many people like attending meetings and taking UDS as the accountability helps to keep them on track. The therapists cover the importance of learning to be comfortable with being uncomfortable and discuss acceptance and commitment therapy normalizing some amount of anxiety, depression, and anger as a part of life. The therapists talk about the concept of self-forgiveness as it related to Steps 4 and 5 reminding people that they are not who they are in their disease. The therapists explain that people who beat up on themselves for actions when using who claim to understand that addiction is a disease still are not fully grasping that it is a disease. The therapists observe that people do not see the actions of a family member with Alzheimer's or Huntington's Disease as defining who the person is when healthy. Lastly, the therapists discuss the Abstinence Stage of Recovery and the tasks involved in this stage as well as the difference between acute withdrawal and pot-acute withdrawal syndrome.     Summary: Tracy Lambert presents today rating her depression as a 3 and her anxiety as a 7.  She reports no changes in her sobriety  date.  Her intent this weekend was to go [facility] to meet up with a another group member; however, she ended up going to the wrong women's meeting in which only 3 other women were in attendance.  The therapist questions what she got out of this meeting and makes the observation that people can sometimes conclude that a meeting will not go well thus not opening themselves up to the possibility that it might.  Tracy Lambert says that on the same day that she attended this meeting that she also attended another meeting which was the first time that she did 2 meetings in 1 day.  She says that she plans on attending a new meeting tonight.  Since she is no longer working, she says that she now has a craving for meetings.  She talks about feeling down on Friday and says that when she came to group today that her mood was low he terrified; however, she says that as a result of being in group today that she is optimistic again and clearheaded.  The therapist makes the observation that Tracy Lambert's brother and some people from her job have negatively impacted her mood by sharing their fears and worries as to what might happen to her.  The therapist models how she might set limits with them.  In discussing how some of them caused her to doubt her decision to go on full leave, the therapist questions what might have happened if she had continued on the  partial leave while not focusing solely on recovery with [facility] [facility] recognizing that she probably would have lost her job.  The therapist points out the worst case scenario which would be Tracy Lambert's not finding a new job while on leave meaning that she could return to her current job and continue to look for something else.  Other group members provide support and validation that she made the best decision in taking this leave and that she thought out the practicalities of it before doing so.     Progress Towards Goals: Tracy Lambert reports no change in her  sobriety date.   UDS collected: No Results: No   AA/NA attended?:  Yes   Sponsor?:  No   Tracy Maier, MA, [place], [facility], LCAS Tracy Simpler, MS, LMFT, LCAS   08/19/2024

## 2024-08-21 ENCOUNTER — Ambulatory Visit (INDEPENDENT_AMBULATORY_CARE_PROVIDER_SITE_OTHER): Payer: Self-pay

## 2024-08-21 DIAGNOSIS — F1994 Other psychoactive substance use, unspecified with psychoactive substance-induced mood disorder: Secondary | ICD-10-CM

## 2024-08-21 DIAGNOSIS — F1020 Alcohol dependence, uncomplicated: Secondary | ICD-10-CM

## 2024-08-21 DIAGNOSIS — F4312 Post-traumatic stress disorder, chronic: Secondary | ICD-10-CM

## 2024-08-21 MED ORDER — BACLOFEN 10 MG PO TABS: 10.0000 mg | ORAL_TABLET | Freq: Three times a day (TID) | ORAL | 0 refills | Status: DC

## 2024-08-21 NOTE — Progress Notes (Signed)
 Daily Group Progress Note   Program: [facility]     Group Time: 9 a.m. to 12 p.m.      Type of Therapy: Process and Psychoeducational    Topic: The therapists check in with group members, assess for SI/HI/psychosis and overall level of functioning. The therapists inquire about sobriety date and number of community support meetings attended since last session.   Therapists discuss the following issues today: how the disease of addition lies trying to convince the addict that they can use and it will not hurt them,how to recognize and hear the disease, importance of putting roadblocks in the way of using, how addiction makes a rationalization for one's use, best course of action involves living honestly. Hanging out with people that are conducive to our recovery.   Summary: Tracy Lambert presents today rating her depression as a 3 and her anxiety as a 7.  She reports no changes in her sobriety date.  Her intent this weekend was to go [facility] to meet up with a another group member; however, she ended up going to the wrong women's meeting in which only 3 other women were in attendance.  The therapist questions what she got out of this meeting and makes the observation that people can sometimes conclude that a meeting will not go well thus not opening themselves up to the possibility that it might.  Tracy Lambert says that on the same day that she attended this meeting that she also attended another meeting which was the first time that she did 2 meetings in 1 day.  She says that she plans on attending a new meeting tonight.  Since she is no longer working, she says that she now has a craving for meetings.  She talks about feeling down on Friday and says that when she came to group today that her mood was low he terrified; however, she says that as a result of being in group today that she is optimistic again and clearheaded.  The therapist makes the observation that Tracy Lambert's  brother and some people from her job have negatively impacted her mood by sharing their fears and worries as to what might happen to her.  The therapist models how she might set limits with them.  In discussing how some of them caused her to doubt her decision to go on full leave, the therapist questions what might have happened if she had continued on the partial leave while not focusing solely on recovery with [facility] recognizing that she probably would have lost her job.  The therapist points out the worst case scenario which would be Tracy Lambert's not finding a new job while on leave meaning that she could return to her current job and continue to look for something else.  Other group members provide support and validation that she made the best decision in taking this leave and that she thought out the practicalities of it before doing so.     Progress Towards Goals: Tracy Lambert reports no change in her sobriety date. She reports she has been to meetings and has been asking people in the meetings if she can call them more but does not yet have a sponsor. She says identifies her emotions as good and living in the moment. She says she is focusing on the present, rather than getting distracted by questions of what she may do for a job and other concerns.  Tracy Lambert provides feedback to other member in his sharing his  struggling with trying to stop smoking.   UDS collected: Yes Results: None   AA/NA attended?:  Yes   Sponsor?:  No   Tracy Maier, MA, [place], [facility], LCAS Tracy Simpler, MS, LMFT, LCAS   08/21/2024
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
## 2024-08-23 ENCOUNTER — Ambulatory Visit (HOSPITAL_COMMUNITY): Payer: Self-pay

## 2024-08-23 ENCOUNTER — Ambulatory Visit (HOSPITAL_COMMUNITY)

## 2024-08-23 DIAGNOSIS — F4312 Post-traumatic stress disorder, chronic: Secondary | ICD-10-CM

## 2024-08-23 DIAGNOSIS — F1994 Other psychoactive substance use, unspecified with psychoactive substance-induced mood disorder: Secondary | ICD-10-CM

## 2024-08-23 DIAGNOSIS — F1020 Alcohol dependence, uncomplicated: Secondary | ICD-10-CM | POA: Diagnosis not present

## 2024-08-23 NOTE — Progress Notes (Signed)
 Daily Group Progress Note   Program: CD IOP     Group Time: 9 a.m. to 12 p.m.      Type of Therapy: Process and Psychoeducational    Topic: The therapists check in with group members, assess for SI/HI/psychosis and overall level of functioning. The therapists inquire about sobriety date and number of community support meetings attended since last session.     The therapists discuss the follow issues today: Introduced the new group member, Learning to listen to the voice of addiction and how to respond to it, the need to change people, places and things associated with use, Share statistics that American's consumer 80% of opioids and 70% of suicides involve one or more substances.  Summary:  Tracy Lambert rates her depression as a 3 and her anxiety as a 5.  She reports the same sobriety date. She says she has attended a meeting since last group but does not have a sponsor. She says she is going to a women's meeting tomorrow and plans to ask for one in that meeting. Tracy Lambert identifies her emotions as thankful and good. She says her work Warden/ranger her and said they would like for her to return to work in December. Tracy Lambert says she does not think that job is conducive to her recovery. Tracy Lambert says she wants to share something she has not shared before and note she really messed up. She says she had looked up the suicide hot line number. Tracy Lambert says she drank rubbing alcohol  because she had no other alcohol  when she got up in the early morning hours. She says when she did this it gave her mouth ulcers and an upset stomach. She says she messed up the [facility] in her mouth and she had never come to this point before. She says it was then she realized what a grip the disease had on her and noted she is not the same person sober as she is when she is drinking. She expressed gratitude for the group and people to reach out to and noted she plans to get a sponsor tomorrow at the [employer]. She denies any current SI.      Progress Towards Goals: reports the same sobriety date   UDS collected: No  Results: None   AA/NA attended?:  Yes   Sponsor?:  No   [name], MA, [employer], [facility], LCAS Darice Simpler, MS, LMFT, LCAS   08/23/2024

## 2024-08-26 ENCOUNTER — Ambulatory Visit (INDEPENDENT_AMBULATORY_CARE_PROVIDER_SITE_OTHER): Payer: Self-pay | Admitting: Licensed Clinical Social Worker

## 2024-08-26 ENCOUNTER — Ambulatory Visit (HOSPITAL_COMMUNITY): Payer: Self-pay

## 2024-08-26 ENCOUNTER — Encounter (HOSPITAL_COMMUNITY): Payer: Self-pay | Admitting: Licensed Clinical Social Worker

## 2024-08-26 DIAGNOSIS — F1020 Alcohol dependence, uncomplicated: Secondary | ICD-10-CM | POA: Diagnosis not present

## 2024-08-26 DIAGNOSIS — F1994 Other psychoactive substance use, unspecified with psychoactive substance-induced mood disorder: Secondary | ICD-10-CM

## 2024-08-26 DIAGNOSIS — Z79899 Other long term (current) drug therapy: Secondary | ICD-10-CM

## 2024-08-26 DIAGNOSIS — F4312 Post-traumatic stress disorder, chronic: Secondary | ICD-10-CM

## 2024-08-26 NOTE — Addendum Note (Signed)
 Addended by: LEILA CARLIN BRAVO on: 08/26/2024 03:59 PM   Modules accepted: Level of Service

## 2024-08-26 NOTE — Progress Notes (Signed)
 Daily Group Progress Note   Program: CD IOP     Group Time: 9 a.m. to 12 p.m.      Type of Therapy: Process and Psychoeducational    Topic: The therapists check in with group members, assess for SI/HI/psychosis and overall level of functioning. The therapists inquire about sobriety date and number of community support meetings attended since last session.   The therapists discuss the importance of avoiding triggers and the need to be smart versus trying to be strong. The therapists also discuss ways people can process grief without having to use drugs and alcohol . The therapists challenge group members to describe the reason or reasons they are choosing to be sober today.    Summary: Sinia presents today rating both her depression and anxiety as a 3.  She ended up getting a sponsor over the weekend saying that her sponsor has asked her to check-in daily, wants her to submit a gratitude list, and has given her some readings to complete.  Genevie says that the main reason that she does not want to drink today is that she does not want to go back to the hell that she went through recently.  When asked what was different about this last time from other times, she says that when she has a drink that there is no stopping her and she will consume what ever she can get her hands on.  The therapist observes that he sounds to have finally taken the first step and realizing that she is powerless over alcohol .  She admits that when she was drinking that her priority was thing about what time it was that she could start drinking rather than focusing on her son.  She also says that she would believe food for an extended period of time not wanting to eat which other group members pointed out is not uncommon given that most alcoholics know that when they eat that they will kill a good buzz and pass out.  Lizett says that she did not want to call her sponsor but did so anyway as she is willing to do what ever it  takes.     Progress Towards Goals: She reports no change in her sobriety date.  UDS collected: Yes Results: No  AA/NA attended?: Yes   Sponsor?:  Yes   [name], MA, LCSW, [facility], LCAS Darice Simpler, MS, LMFT, LCAS 08/26/2024

## 2024-08-28 ENCOUNTER — Ambulatory Visit (HOSPITAL_COMMUNITY): Payer: Self-pay

## 2024-08-28 ENCOUNTER — Ambulatory Visit (INDEPENDENT_AMBULATORY_CARE_PROVIDER_SITE_OTHER): Payer: Self-pay | Admitting: Licensed Clinical Social Worker

## 2024-08-28 DIAGNOSIS — F1020 Alcohol dependence, uncomplicated: Secondary | ICD-10-CM | POA: Diagnosis not present

## 2024-08-28 DIAGNOSIS — F1994 Other psychoactive substance use, unspecified with psychoactive substance-induced mood disorder: Secondary | ICD-10-CM

## 2024-08-28 DIAGNOSIS — Z79899 Other long term (current) drug therapy: Secondary | ICD-10-CM

## 2024-08-28 DIAGNOSIS — F4312 Post-traumatic stress disorder, chronic: Secondary | ICD-10-CM

## 2024-08-28 NOTE — Progress Notes (Signed)
 Daily Group Progress Note   Program: CD IOP     Group Time: 9 a.m. to 12 p.m.      Type of Therapy: Process and Psychoeducational    Topic: The therapists check in with group members, assess for SI/HI/psychosis and overall level of functioning. The therapists inquire about sobriety date and number of community support meetings attended since last session.   The therapists focused primarily today on external triggers having group members complete the external trigger exercise from the matrix model.  The therapists emphasize the fact that if they hope to establish long-term recovery that they must be willing to change their lives and not interact with persons and active addiction in their personal life in any way, shape, or form.  The therapists also normalize boredom as being part of early recovery as a result of postacute withdrawal syndrome.   Summary: Tracy Lambert presents today rating her depression and anxiety as a 3.  When another group member talks about her husband not being able to understand her, Tracy Lambert shares about the fact that she does not feel like she can talk about her feelings or her addiction with her brother but notes that he is still supportive in other ways.  Tracy Lambert says that she is going to have a meeting with her sponsor so that they can get better acquainted saying that this will help her to hopefully be more comfortable and calling her. Tracy Lambert describes her mood as thankful and peaceful.  She says that she has realized that she is a person who tends to be pretty happy and that the source of her anxiety has been her use of alcohol .  In discussing triggers, she says that she would not be able to go on a date without getting very drunk so has put any form of dating on hold for this specific reason.  She recounts going on a trip in the past and having a wonderful day and then opening a refrigerator and seeing the vodka which ended up triggering her such that she eventually became  extremely intoxicated, used benzodiazepines, and had to go to bed very early.  She shows a great deal of insight into how easily she could be triggered.  She says that she was around someone who is drinking while she was not drinking at the time but says that being exposed to this caused her to return to serious drinking about 3 days later.  The therapist makes the observation that Tracy Lambert has made a lot of improvement since starting the program and that she seems to be putting her recovery first and foremost.   Progress Towards Goals: Tracy Lambert reports no change in her sobriety date.  UDS collected: No Results: No   AA/NA attended?:  Yes   Sponsor?:  Yes   [name], MA, LCSW, [facility], LCAS Darice Simpler, MS, LMFT, LCAS 08/28/2024

## 2024-08-29 ENCOUNTER — Ambulatory Visit: Admitting: Physician Assistant

## 2024-08-29 ENCOUNTER — Encounter: Payer: Self-pay | Admitting: Physician Assistant

## 2024-08-29 DIAGNOSIS — G4700 Insomnia, unspecified: Secondary | ICD-10-CM

## 2024-08-29 DIAGNOSIS — F411 Generalized anxiety disorder: Secondary | ICD-10-CM

## 2024-08-29 DIAGNOSIS — F1021 Alcohol dependence, in remission: Secondary | ICD-10-CM

## 2024-08-29 DIAGNOSIS — F39 Unspecified mood [affective] disorder: Secondary | ICD-10-CM | POA: Diagnosis not present

## 2024-08-29 MED ORDER — QUETIAPINE FUMARATE 100 MG PO TABS
ORAL_TABLET | ORAL | 1 refills | Status: AC
Start: 2024-08-29 — End: ?

## 2024-08-29 NOTE — Progress Notes (Signed)
 [facility] Check  Patient ID: Tracy Lambert,  MRN: [id number]  PCP: Patient, No Pcp Per  Date of Evaluation: 08/29/2024 Time spent:20 minutes  Chief Complaint:  Chief Complaint   Follow-up    HISTORY/CURRENT STATUS: HPI Follow up  Tracy Lambert is doing really well.  She has been out of work since October 1 for continuous FMLA, from a provider at behavioral health.  Will probably be out another month or so.  She is taking baclofen  for ETOH cravings and has not had any alcohol  for 15 or 16 days now.  She is in IOP 3 days a week which is really good.  She goes to [employer] 5 to 6 days a week and also meets with her sponsor.  States she is in a good place right now and does not want to change anything.  She never started the acamprosate .  It seemed daunting.  The baclofen  seems to be helping.  Energy and motivation are better.  No extreme sadness, tearfulness, or feelings of hopelessness.  Sleeps well with the Seroquel . ADLs and personal hygiene are normal.   Denies any changes in concentration, making decisions, or remembering things.  Appetite has not changed.  Weight is stable.  Reports of mania, delirium, or hallucinations.  No SI/HI.  Individual Medical History/ Review of Systems: Changes? :No   Past Psychiatric Medication Trials: Cymbalta  Zoloft  Prozac  Buspar Seroquel - Reports that she felt over medicated on higher doses of Seroquel  (possibly 200 mg) Xanax  Propranolol  Phentermine- increased anxiety, tension Topamax  Acamprosate  was prescribed but she never took.  Rehab for ETOH in 2023, AA, IOP at [facility] behavioral health in 2025  Allergies: Patient has no known allergies.  Current Medications:  Current Outpatient Medications:    b complex vitamins capsule, Take 1 capsule by mouth daily., Disp: , Rfl:    baclofen  (LIORESAL ) 10 MG tablet, Take 1 tablet (10 mg total) by mouth 3 (three) times daily., Disp: 90 each, Rfl: 0   buPROPion  (WELLBUTRIN  XL) 150 MG 24 hr tablet,  Take 1 tablet (150 mg total) by mouth daily., Disp: 90 tablet, Rfl: 1   FLUoxetine  (PROZAC ) 40 MG capsule, Take 1 capsule (40 mg total) by mouth daily. TAKE 1 CAPSULE BY MOUTH EVERY DAY, Disp: 90 capsule, Rfl: 1   Multiple Vitamin (MULTIVITAMIN) tablet, Take 1 tablet by mouth daily., Disp: , Rfl:    Omega-3 Fatty Acids (FISH OIL PO), Take by mouth., Disp: , Rfl:    propranolol  (INDERAL ) 20 MG tablet, TAKE 1 TABLET BY MOUTH EVERYDAY AT BEDTIME, Disp: 90 tablet, Rfl: 0   Pyridoxine HCl (VITAMIN B-6 PO), Take by mouth., Disp: , Rfl:    SUMAtriptan  (IMITREX ) 50 MG tablet, TAKE ONE TABLET BY MOUTH AT ONSET OF HEADACHE MAY REPEAT ONE TABLET IN 2 HOURS IF NEEDED., Disp: 9 tablet, Rfl: 1   traZODone  (DESYREL ) 50 MG tablet, Take 1 tablet (50 mg total) by mouth at bedtime as needed. for sleep, Disp: 90 tablet, Rfl: 1   VITAMIN D, CHOLECALCIFEROL, PO, Take by mouth., Disp: , Rfl:    QUEtiapine  (SEROQUEL ) 100 MG tablet, TAKE 1 TABLET BY MOUTH EVERYDAY AT BEDTIME, Disp: 90 tablet, Rfl: 1 Medication Side Effects: none  Family Medical/ Social History: Changes? See HPI  MENTAL HEALTH EXAM:  There were no vitals taken for this visit.There is no height or weight on file to calculate BMI.  General Appearance: Casual and Well Groomed  Eye Contact:  Good  Speech:  Clear and Coherent and Normal Rate  Volume:  Normal  Mood:  Euthymic  Affect:  Congruent  Thought Process:  Goal Directed and Descriptions of Associations: Circumstantial  Orientation:  Full (Time, Place, and Person)  Thought Content: Logical   Suicidal Thoughts:  No  Homicidal Thoughts:  No  Memory:  WNL  Judgement:  Good  Insight:  Good  Psychomotor Activity:  Normal  Concentration:  Concentration: Good  Recall:  Good  Fund of Knowledge: Good  Language: Good  Assets:  Communication Skills Desire for Improvement Financial Resources/Insurance Housing Leisure Time Physical Health Resilience Transportation Vocational/Educational   ADL's:  Intact  Cognition: WNL  Prognosis:  Good   DIAGNOSES:    ICD-10-CM   1. Episodic mood disorder  F39 QUEtiapine  (SEROQUEL ) 100 MG tablet    2. Anxiety state  F41.1 QUEtiapine  (SEROQUEL ) 100 MG tablet    3. Insomnia, unspecified type  G47.00 QUEtiapine  (SEROQUEL ) 100 MG tablet    4. Alcohol  use disorder, moderate, in early remission (HCC)  F10.21       Receiving Psychotherapy: No   RECOMMENDATIONS:   PDMP reviewed.  Xanax  filled 06/04/2024. I provided approximately  20 minutes of face to face time during this encounter, including time spent before and after the visit in records review, medical decision making, counseling pertinent to today's visit, and charting.   I am glad to see Tracy Lambert doing so well!  It is good that she is able to be out of work for a while to focus on her own mental health.  Continue Wellbutrin  XL 150 mg, 1  am. Continue Prozac  40 mg, 1 every day. Continue propranolol  20 mg p.o. nightly. Continue Seroquel  100 mg at bedtime. Continue Trazodone  50 mg, 1 at bedtime prn. Continue vitamins and supplements as per med list. Continue AA. Continue IOP. Return in 3 months.   Verneita Cooks, PA-C

## 2024-08-30 ENCOUNTER — Ambulatory Visit (INDEPENDENT_AMBULATORY_CARE_PROVIDER_SITE_OTHER): Payer: Self-pay | Admitting: Licensed Clinical Social Worker

## 2024-08-30 DIAGNOSIS — Z79899 Other long term (current) drug therapy: Secondary | ICD-10-CM

## 2024-08-30 DIAGNOSIS — F1994 Other psychoactive substance use, unspecified with psychoactive substance-induced mood disorder: Secondary | ICD-10-CM

## 2024-08-30 DIAGNOSIS — F4312 Post-traumatic stress disorder, chronic: Secondary | ICD-10-CM

## 2024-08-30 DIAGNOSIS — F1020 Alcohol dependence, uncomplicated: Secondary | ICD-10-CM

## 2024-08-30 NOTE — Progress Notes (Signed)
 Daily Group Progress Note   Program: CD IOP     Group Time: 9 a.m. to 12 p.m.      Type of Therapy: Process and Psychoeducational    Topic: The therapists check in with group members, assess for SI/HI/psychosis and overall level of functioning. The therapists inquire about sobriety date and number of community support meetings attended since last session.   The therapists have group members share and discuss their answers from their external trigger questionnaire. The therapists facilitate a group discussion on war stories. They observe that people in active addiction often use humor as a defense against recognizing the consequences of their actions while using.    Summary: Tracy Lambert presents rating both her depression and anxiety as a 3. She says that she would never drink at work or before work but would always drink if home alone without her son or on vacation.   Tracy Lambert says that she had the one-on-one meeting with her Sponsor that went great. Her Sponsor passed her on steps one through three and wants Tracy Lambert to do her 4th step over the next week. She also had Tracy Lambert read Bill's story. Tracy Lambert says that when she read this and other stories in the past that she never put much into it; however, in doing so this time, she realized, I am Tracy Lambert and Tracy Lambert is me.   She once again recounts her story of drinking rubbing alcohol   recognizing that if she were to drink again that there are several ways that it could end up in her death. The therapist makes the observation that Tracy Lambert has woken up.   Tracy Lambert says that she had a migraine this morning and has always been a person who chronically cancels appointments. She says that she is happy that she took her migraine medication and did not cancel group. She says that she loves this group adding that she has never been able to say this about any group before in the past.       Progress Towards Goals: Tracy Lambert reports no change in her sobriety date.  UDS collected:  No Results: Yes, negative for drugs and alcohol    AA/NA attended?:  Yes   Sponsor?:  Yes   [name], MA, LCSW, [facility], LCAS Darice Simpler, MS, LMFT, LCAS 08/30/2024

## 2024-09-02 ENCOUNTER — Ambulatory Visit (INDEPENDENT_AMBULATORY_CARE_PROVIDER_SITE_OTHER): Payer: Self-pay | Admitting: Licensed Clinical Social Worker

## 2024-09-02 DIAGNOSIS — Z79899 Other long term (current) drug therapy: Secondary | ICD-10-CM

## 2024-09-02 DIAGNOSIS — F4312 Post-traumatic stress disorder, chronic: Secondary | ICD-10-CM

## 2024-09-02 DIAGNOSIS — F1994 Other psychoactive substance use, unspecified with psychoactive substance-induced mood disorder: Secondary | ICD-10-CM

## 2024-09-02 DIAGNOSIS — F1020 Alcohol dependence, uncomplicated: Secondary | ICD-10-CM

## 2024-09-02 NOTE — Progress Notes (Signed)
 Daily Group Progress Note   Program: CD IOP     Group Time: 9 a.m. to 12 p.m.      Type of Therapy: Process and Psychoeducational    Topic: The therapists check in with group members, assess for SI/HI/psychosis and overall level of functioning. The therapists inquire about sobriety date and number of community support meetings attended since last session.   The therapists answer group members' questions about the MAT drug, baclofen , and show a short video explaining the impact of this medication on the brain. The therapists emphasize that MAT is another tool in a person's recovery; however, a person on MAT still needs to avoid people, places, and things associated with drugs and alcohol .  The therapists facilitate a discussion on limit setting and co-dependency using one group member's situation with her family in particular to illustrate these concepts.    Summary: Alan presents today rating her depression and anxiety both as a 3. She says that she met recently with her psychiatrist and told her to get rid of the Xanax  prescription. She says that her psychiatrist and others note a change in her with Esmay saying that she feels like this time is different as she feels like she has finally surrended.  She says that she was cooking this weekend and had a bottle of vanilla extract and was aware of the fact that one drink of it would lead back to a full relapse which is something she previously would not have recognized.   Sebastiana describes her mood as peaceful and trusting. She says that she now recognizes that she is able to create a wonderful future adding that she was not trusting of this 2-3 weeks ago.   She says that she is a very capable person but not a capable human being when drinking. She says that she has never been able to inhale properly due to stress; however, now she can.   She checks in with her Sponsor daily but says that she needs to call her more rather than checking in  via text. Her Sponsor sent Latesia two invites for a potluck dinner with her other Sponsees with Mairyn feeling excited about this. Zhaniya notes that she is feeling pressured to work on her resume but says that [facility] has been a wonderful tool in helping with this.  When another group member talks about problems with her over-bearing mother, Eleni says that she had therapy for years to deal with her own issues with her mother.    Progress Towards Goals: Christna reports no change in her sobriety date.  UDS collected: Yes Results: No   AA/NA attended?:  Yes   Sponsor?:  Yes   [name], MA, LCSW, [facility], LCAS Darice Simpler, MS, LMFT, LCAS 09/02/2024

## 2024-09-04 ENCOUNTER — Encounter (HOSPITAL_COMMUNITY): Payer: Self-pay | Admitting: Medical

## 2024-09-04 ENCOUNTER — Ambulatory Visit (INDEPENDENT_AMBULATORY_CARE_PROVIDER_SITE_OTHER): Payer: Self-pay | Admitting: Medical

## 2024-09-04 DIAGNOSIS — Z811 Family history of alcohol abuse and dependence: Secondary | ICD-10-CM

## 2024-09-04 DIAGNOSIS — F4312 Post-traumatic stress disorder, chronic: Secondary | ICD-10-CM

## 2024-09-04 DIAGNOSIS — Z79899 Other long term (current) drug therapy: Secondary | ICD-10-CM

## 2024-09-04 DIAGNOSIS — F1021 Alcohol dependence, in remission: Secondary | ICD-10-CM

## 2024-09-04 DIAGNOSIS — F39 Unspecified mood [affective] disorder: Secondary | ICD-10-CM

## 2024-09-04 DIAGNOSIS — G4700 Insomnia, unspecified: Secondary | ICD-10-CM

## 2024-09-04 DIAGNOSIS — F411 Generalized anxiety disorder: Secondary | ICD-10-CM

## 2024-09-04 DIAGNOSIS — F418 Other specified anxiety disorders: Secondary | ICD-10-CM

## 2024-09-04 DIAGNOSIS — F1994 Other psychoactive substance use, unspecified with psychoactive substance-induced mood disorder: Secondary | ICD-10-CM

## 2024-09-04 DIAGNOSIS — F1020 Alcohol dependence, uncomplicated: Secondary | ICD-10-CM

## 2024-09-04 MED ORDER — BUPROPION HCL ER (SR) 150 MG PO TB12: 150.0000 mg | ORAL_TABLET | Freq: Two times a day (BID) | ORAL | 2 refills | Status: DC

## 2024-09-04 NOTE — Progress Notes (Signed)
 Daily Group Progress Note   Program: CD IOP     Group Time: 9 a.m. to 12 p.m.      Type of Therapy: Process and Psychoeducational    Topic: The therapists check in with group members, assess for SI/HI/psychosis and overall level of functioning. The therapists inquire about sobriety date and number of community support meetings attended since last session.   The therapist introduces newest group member. Therapist facilitates discussion on the following issues: important of sober support, including 12 step meetings and having a sponsor, Stages of Relapse including emotional, mental and physical, importance of self-care in early recovery, how depression can lead to suicidal thoughts and the interaction of substance use on depression, including depression being substance induced, how to make a safety plan     Summary: Timica presents today rating her depression and anxiety both as a 4. She reports the same sobriety date. She says she is attending meetings and has a sponsor. Hennie identifies her emotions as subdued and needs to tweek something.  She elaborates saying she is not accomplishing what she wants to. She says sometimes she just lies on the couch and finds herself bored. Therapist discusses boredom as an internal trigger that can put someone in emotional relapse. Therapist discusses making a schedule and engaging in exercise to combat inertia.   Terria encourages a peer who is going through an experience of having her heart broke by a boyfriend. She shares her own experience and discusses how to work through it, which includes self care.   Progress Towards Goals: Abrar reports no change in her sobriety date.  UDS collected: No Results: None   AA/NA attended?:  Yes   Sponsor?:  Yes    Darice Simpler, MS, LMFT, LCAS 09-04-24

## 2024-09-04 NOTE — Progress Notes (Signed)
 [facility] Follow-up Outpatient CDIOP Date: 09/04/2024  Admission Date:07/03/2024  Sobriety date:08/12/2024  Subjective: I know now how extreme my craving was after just one drink. I would kill to get more if I had to.  HPI : CD IOP Provider FU Shakeitha is seen for FU on her Genesight study and medication management hasw finally seen that her brain reacts to alcohol  with a severe combination  of obsessive reaction beyond her mental control which requires her to keep alcohol  out of her body/brain.   Genesight has her Prozac  in Red for side effects warning Wellbutrin  is Green zone Low dose Propranolol  is proper. Seroquel  is Green zone She is thinking she would like to take something other than Wellbutrin . Her self rating of Depression is 2 Anxiety 7 She continues to find AA helpful and is increasing her attendance and engaging with a Sponsor.  Counselor's report: Summary: Pattiann presents today rating her depression as a 3 and her anxiety as a 7.  She reports no changes in her sobriety date.  Her intent this weekend was to go to the women's only meeting at the [facility] to meet up with a another group member; however, she ended up going to the wrong women's meeting in which only 3 other women were in attendance.  The therapist questions what she got out of this meeting and makes the observation that people can sometimes conclude that a meeting will not go well thus not opening themselves up to the possibility that it might.   Liyah says that on the same day that she attended this meeting that she also attended another meeting which was the first time that she did 2 meetings in 1 day.  She says that she plans on attending a new meeting tonight.  Since she is no longer working, she says that she now has a craving for meetings.   She talks about feeling down on Friday and says that when she came to group today that her mood was low he terrified; however, she says that as a result of  being in group today that she is optimistic again and clearheaded.   The therapist makes the observation that Rosaline's brother and some people from her job have negatively impacted her mood by sharing their fears and worries as to what might happen to her.  The therapist models how she might set limits with them.  In discussing how some of them caused her to doubt her decision to go on full leave, the therapist questions what might have happened if she had continued on the partial leave while not focusing solely on recovery with [facility] recognizing that she probably would have lost her job.  The therapist points out the worst case scenario which would be Tylea's not finding a new job while on leave meaning that she could return to her current job and continue to look for something else.  Other group members provide support and validation that she made the best decision in taking this leave and that she thought out the practicalities of it before doing so.  Review of Systems: Psychiatric: Agitation: See Counselor report Hallucination: No Depressed Mood: see Counselor reports Insomnia: rx Seroquel  Hypersomnia: No Altered Concentration: No Feels Worthless: Chronic self esteem issues from childhood with alcoholic Mother  Grandiose Ideas: No Belief In Special Powers: No New/Increased Substance Abuse: No Compulsions: In early withdrawal  Neurologic: Headache: No Seizure: No Paresthesias: No  Current Medications: Your Medication List  b complex vitamins capsule  Take 1 capsule by mouth daily.  baclofen  10 MG tablet Commonly known as: LIORESAL  Take 1 tablet (10 mg total) by mouth 3 (three) times daily.  buPROPion  150 MG 12 hr tablet Commonly known as: Wellbutrin  SR Take 1 tablet (150 mg total) by mouth 2 (two) times daily.  FISH OIL PO Take by mouth.  FLUoxetine  40 MG capsule Commonly known as: PROZAC  Take 1 capsule (40 mg total) by mouth daily. TAKE 1 CAPSULE BY MOUTH EVERY DAY  multivitamin  tablet Take 1 tablet by mouth daily.  propranolol  20 MG tablet Commonly known as: INDERAL  TAKE 1 TABLET BY MOUTH EVERYDAY AT BEDTIME  QUEtiapine  100 MG tablet Commonly known as: SEROQUEL  TAKE 1 TABLET BY MOUTH EVERYDAY AT BEDTIME  SUMAtriptan  50 MG tablet Commonly known as: IMITREX  TAKE ONE TABLET BY MOUTH AT ONSET OF HEADACHE MAY REPEAT ONE TABLET IN 2 HOURS IF NEEDED.  traZODone  50 MG tablet Commonly known as: DESYREL  Take 1 tablet (50 mg total) by mouth at bedtime as needed. for sleep  VITAMIN B-6 PO Take by mouth.  VITAMIN D (CHOLECALCIFEROL) PO Take by mouth.    Mental Status Examination  Appearance:Neat Alert: Yes Attention: good  Cooperative: Yes Eye Contact: Good Speech: Clear and coherent, rate WNL Psychomotor Activity: Normal Memory:Trauma informed Concentration/Attention: Normal/intact Oriented: person, place, time/date and situation Mood: Euthymic Affect: Appropriate and Congruent Thought Processes and Associations: Coherent and Intact Fund of Knowledge: Good Thought Content: WDL Insight:Improving Judgement:Improving  LID:Rozjm 08/21/2024  PDMP: 06/04/2024 03/20/2024  1 Alprazolam  0.5 Mg Tablet 30.00 30    Diagnosis:  Alcohol  use disorder, severe, dependence (HCC) Chronic prescription benzodiazepine use Chronic post-traumatic stress disorder (PTSD) Substance induced mood disorder (HCC) Episodic mood disorder Anxiety state Insomnia, unspecified type Alcohol  use disorder, moderate, in early remission (HCC) Family history of alcoholism in mother Anxious depression Biological mother, perpetrator of maltreatment and neglec  Assessment:APPEARS TO HAVE TURNED THE CORNER ON USING ALCOHOL  Now begins the work of real recovery  Treatment Plan:Per Admission and Counselors D/C Prozac  DC Wellbutrin  XL 150 Rx Wellbutrin  SR 150mg  BID FU 1-2 weeks/PRN Carlin Emmer, PA-CPatient ID: Tracy Lambert, female   DOB: May 05, 1976, 48 y.o.   MRN: [id number]

## 2024-09-05 ENCOUNTER — Encounter: Payer: Self-pay | Admitting: Medical

## 2024-09-06 ENCOUNTER — Ambulatory Visit (INDEPENDENT_AMBULATORY_CARE_PROVIDER_SITE_OTHER): Payer: Self-pay | Admitting: Licensed Clinical Social Worker

## 2024-09-06 DIAGNOSIS — F1994 Other psychoactive substance use, unspecified with psychoactive substance-induced mood disorder: Secondary | ICD-10-CM

## 2024-09-06 DIAGNOSIS — F4312 Post-traumatic stress disorder, chronic: Secondary | ICD-10-CM

## 2024-09-06 DIAGNOSIS — Z79899 Other long term (current) drug therapy: Secondary | ICD-10-CM

## 2024-09-06 DIAGNOSIS — F1020 Alcohol dependence, uncomplicated: Secondary | ICD-10-CM

## 2024-09-06 NOTE — Progress Notes (Signed)
 Daily Group Progress Note   Program: CD IOP     Group Time: 9 a.m. to 12 p.m.      Type of Therapy: Process and Psychoeducational    Topic: The therapists check in with group members, assess for SI/HI/psychosis and overall level of functioning. The therapists inquire about sobriety date and number of community support meetings attended since last session.   The therapists welcoming new member to group.  The therapists explain what working the fourth step entails in addition to discussing what a sponsor does and how to find one.  The therapist also educate the new members on what the big book and basic text of NA are and how to be able to listen to the audio format of these texts in addition to reading them.  The therapists facilitate a group discussion on the importance of the first step and what powerlessness from addiction looks like.   Summary: Nakaiya presents today rating her depression and anxiety both as a 4.  She says that she got the results of her GeneSight testing back and found out that the depression medicine that she has been taking for years his in the red so was switched to a new medication.  She describes her mood as functional and good.  She attended a potluck dinner hosted by her sponsor at her sponsor saying that it was a very positive experience.  Presently, she is working on her fourth step and admits that at first she was off was off put by having to do the sexual inventory; however, her sponsor explained the reason for this.  Samanthamarie tells her story to the new group member saying that she has been struggling and trying to get sober since 2017 but now feels like she is possessed in a good way such that she believes that she will make at this time.  She says that she used to not be very much into meetings and did not like reading the big book but now is into both and looks forward to coming to meetings saying that she looks forward to coming back to IOP again on  Monday.        Progress Towards Goals: Yitta reports no change in her sobriety date.  UDS collected: Yes Results: Yes, negative for drugs and alcohol    AA/NA attended?: Yes   Sponsor?:  Yes   [name], MA, LCSW, [facility], LCAS Darice Simpler, MS, LMFT, LCAS 09/06/2024

## 2024-09-09 ENCOUNTER — Ambulatory Visit (INDEPENDENT_AMBULATORY_CARE_PROVIDER_SITE_OTHER): Payer: Self-pay | Admitting: Licensed Clinical Social Worker

## 2024-09-09 DIAGNOSIS — F1020 Alcohol dependence, uncomplicated: Secondary | ICD-10-CM

## 2024-09-09 DIAGNOSIS — Z79899 Other long term (current) drug therapy: Secondary | ICD-10-CM

## 2024-09-09 DIAGNOSIS — F1994 Other psychoactive substance use, unspecified with psychoactive substance-induced mood disorder: Secondary | ICD-10-CM

## 2024-09-09 DIAGNOSIS — F4312 Post-traumatic stress disorder, chronic: Secondary | ICD-10-CM

## 2024-09-09 NOTE — Progress Notes (Signed)
 Daily Group Progress Note   Program: CD IOP     Group Time: 9 a.m. to 12 p.m.      Type of Therapy: Process and Psychoeducational    Topic: The therapist checks in with group members, assesses for SI/HI/psychosis and overall level of functioning. The therapist inquires about sobriety date and number of community support meetings attended since last session.   The therapist has group members complete and discuss the [facility] Internal Trigger Questionnaire ERS 3A. The therapist discusses distress tolerance and presents research that supports that people's ability to deal with distress improves the longer one is sober from drugs and alcohol . The therapist confronts the myth about the doctor who could not operate if not intoxicated. The therapist shares data to support that people are better drivers, musicians, artists, engineering geologist upon gaining long-term sobriety than they ever were in active addiction. The therapist explains that working Steps is more in depth than it appears and is a life-long process. The therapist observes that active addiction is not compatible with spirituality. He covers the three C's of dealing with an addict: I didn't cause it. I can't cure it. I can't control it and the four D's of dealing with cravings: delay, distract, de-stress, and de-catastrophize. Lastly, he covers the three P's of addiction: Patience, Persistence, and Perseverance.    Summary: Tracy Lambert presents rating her depression and anxiety both as a 3. She describes her mood as content and optimistic.   In completing the Internal Trigger Questionnaire, she says that she has a problem with adrenaline apparently having difficulty calming down once excited. She says that PMS would normally be a trigger noting that she is in that part of her cycle now; however, not wanting to drink.  When she first came to treatment, she says that her using was due both to routine and to emotional conditions. Tracy Lambert says that  she never would use when worried and in need to taking action on something as she had to be focused and determined.   She concludes that her biggest emotional triggers for drinking were anger and boredom with boredom likely topping the list. Tracy Lambert admits that she tried cancelling on her Sponsor recently but did not as she got called out about it. She worked her 4th and 5th Step this weekend saying that it took about 4 hours and that this is the first time in 7 years that she was able to do it. She explains to other group members why this process is important and what she got out of it. Tracy Lambert concludes that one of the main things she realized was how much she compares herself to others.   She attended two meetings on Saturday rather than one which is unusual for her. She indicates that she is on a positive path with almost having 30 days but does not feel ready to leave group yet.        Progress Towards Goals: Tracy Lambert reports no change in her sobriety date.  UDS collected: Yes Results: No   AA/NA attended?: Yes   Sponsor?:  Yes   [name], MA, LCSW, [facility], LCAS 09/09/2024

## 2024-09-10 ENCOUNTER — Other Ambulatory Visit: Payer: Self-pay | Admitting: Physician Assistant

## 2024-09-11 ENCOUNTER — Ambulatory Visit (HOSPITAL_COMMUNITY): Payer: Self-pay

## 2024-09-13 ENCOUNTER — Ambulatory Visit (HOSPITAL_COMMUNITY): Payer: Self-pay | Admitting: Licensed Clinical Social Worker

## 2024-09-13 DIAGNOSIS — F1020 Alcohol dependence, uncomplicated: Secondary | ICD-10-CM

## 2024-09-13 DIAGNOSIS — F4312 Post-traumatic stress disorder, chronic: Secondary | ICD-10-CM

## 2024-09-13 DIAGNOSIS — Z79899 Other long term (current) drug therapy: Secondary | ICD-10-CM

## 2024-09-13 DIAGNOSIS — F1994 Other psychoactive substance use, unspecified with psychoactive substance-induced mood disorder: Secondary | ICD-10-CM

## 2024-09-13 NOTE — Progress Notes (Signed)
 Daily Group Progress Note   Program: CD IOP     Group Time: 9 a.m. to 12 p.m.      Type of Therapy: Process and Psychoeducational    Topic: The therapists check in with group members, assess for SI/HI/psychosis and overall level of functioning. The therapists inquire about sobriety date and number of community support meetings attended since last session.   The therapists facilitate group discussions on a number of topics including integrated dual disorders treatment and dealing with trauma in relation to recovery from addiction. The therapists discuss the history of AA explaining who Bill W. and Dr. Octaviano are and encouraging group members to read the big book for the basic text of Narcotics Anonymous.  The therapist recommend that people in recovery need to identify whether things such as people, their living situation, their job, etc. present as being a barrier to recovery; and if so, need to do something to remove this barrier.   Summary: Tracy Lambert presents today rating both her depression and anxiety as a 3.  She describes her mood as proud and anxious.  She says that she went to a meeting recently and picked up her 30-day chip admitting that she initially was not going to do this but realized that she wanted to break from her old pattern as she did not pick up her 30-day chip the last time she reached this milestone.  She says that being in recovery and being out of work has been an artist process but worries that she might fall back into her old pattern if she goes back to work at her old job wondering if it will cause a relapse.  She was in the process of applying for a new job but became fearful about checking the box saying that she did not want them to call her current employer fearing that they may contact her employer regardless.  Hattie says that her brother found out that she was dipping into her retirement but did not have an issue with it when she explained to him what this  therapist had said concerning what benefit it would be to have this retirement money if she were dead as a result of her continued drinking and unable to collect it.  Given that her brother is a product/process development scientist and tends to cause Mianna to worry, the therapist recommends that she might want to stop asking him for his input.  The therapist suggests that she deal with the work situation away that the 12-step program would indicate which she has to take it 1 day at a time noting that the only thing she can do about it now is to start applying for different positions.     Progress Towards Goals: Lynette reports no change in her sobriety date.   UDS collected: No Results: Yes, negative for drugs and alcohol    AA/NA attended?: Yes   Sponsor?:  Yes   [name], MA, LCSW, [facility], LCAS Darice Simpler, MS, LMFT, LCAS 09/13/2024

## 2024-09-16 ENCOUNTER — Ambulatory Visit (INDEPENDENT_AMBULATORY_CARE_PROVIDER_SITE_OTHER): Admitting: Licensed Clinical Social Worker

## 2024-09-16 DIAGNOSIS — Z79899 Other long term (current) drug therapy: Secondary | ICD-10-CM

## 2024-09-16 DIAGNOSIS — F1994 Other psychoactive substance use, unspecified with psychoactive substance-induced mood disorder: Secondary | ICD-10-CM

## 2024-09-16 DIAGNOSIS — F1020 Alcohol dependence, uncomplicated: Secondary | ICD-10-CM | POA: Diagnosis not present

## 2024-09-16 DIAGNOSIS — F4312 Post-traumatic stress disorder, chronic: Secondary | ICD-10-CM

## 2024-09-16 NOTE — Progress Notes (Signed)
 Daily Group Progress Note   Program: CD IOP     Group Time: 9 a.m. to 12 p.m.      Type of Therapy: Process and Psychoeducational    Topic: The therapists check in with group members, assess for SI/HI/psychosis and overall level of functioning. The therapists inquire about sobriety date and number of community support meetings attended since last session.   The therapists explain the reason that SA IOP and Twelve Step programs are abstinence-based via showing the video, The Psychology of Addiction by Roni Stager as well as a portion of the video, New Perspectives on Addiction & Recovery. The therapists explain that alcohol  is a drug that disinhibits the prefrontal cortex in addition to discussing the cross tolerance between benzodiazepines and alcohol  and why benzodiazepines are contraindicated in persons with alcohol  us  disorders. The therapists also stress that any amount of use is too much and that persons in recovery must avoid people, place, and things associated with using.    Summary: Tracy Lambert presents rating her depression as a 2 and her anxiety as a 4.   She feels thankful but has a twinge of anxiety from not working having not been out-of-work for 25 years. She is also concerned that she is enjoying being out so much that she does not feel like working.   She talks about how impatient she used to be when she was drinking saying that she could not even wait for her water bottle to be filled but now is much more patient. The therapist suggests that she talk to her Sponsor about the work situation as she has not done so. She hasn't spoke to her Sponsor over the weekend admitting that she has been avoiding her as she has not read the 5 pages that her Sponsor assigned her to read.  She is encouraged to read the pages and contact her Sponsor and to discuss the work situation when she does. During group today, she talks about how she was not aware that that using benzodiazepines changed  her sobriety date or that using them negatively impacted her ability to stay sober but now knows that she must avoid them and other potentially addictive substances entirely.    Progress Towards Goals: Tracy Lambert reports no change in her sobriety date.  UDS collected: Yes Results: No   AA/NA attended?: Yes   Sponsor?:  Yes   [name], MA, LCSW, [facility], LCAS Darice Simpler, MS, LMFT, LCAS 09/16/2024

## 2024-09-18 ENCOUNTER — Telehealth (HOSPITAL_COMMUNITY): Payer: Self-pay | Admitting: Licensed Clinical Social Worker

## 2024-09-18 ENCOUNTER — Other Ambulatory Visit (HOSPITAL_COMMUNITY): Payer: Self-pay | Admitting: Medical

## 2024-09-18 ENCOUNTER — Ambulatory Visit (HOSPITAL_COMMUNITY): Payer: Self-pay

## 2024-09-18 NOTE — Telephone Encounter (Signed)
 Tracy Lambert calls saying that she will not be in group today due to waking up with a migraine.  Zell Maier, MA, LCSW, [facility], LCAS 09/18/2024

## 2024-09-20 ENCOUNTER — Ambulatory Visit (INDEPENDENT_AMBULATORY_CARE_PROVIDER_SITE_OTHER): Payer: Self-pay

## 2024-09-20 DIAGNOSIS — F4312 Post-traumatic stress disorder, chronic: Secondary | ICD-10-CM

## 2024-09-20 DIAGNOSIS — F1994 Other psychoactive substance use, unspecified with psychoactive substance-induced mood disorder: Secondary | ICD-10-CM

## 2024-09-20 DIAGNOSIS — Z79899 Other long term (current) drug therapy: Secondary | ICD-10-CM

## 2024-09-20 DIAGNOSIS — F1020 Alcohol dependence, uncomplicated: Secondary | ICD-10-CM | POA: Diagnosis not present

## 2024-09-20 NOTE — Progress Notes (Addendum)
 Daily Group Progress Note   Program: CD IOP     Group Time: 9 a.m. to 12 p.m.      Type of Therapy: Process and Psychoeducational    Topic: The therapists check in with group members, assess for SI/HI/psychosis and overall level of functioning. The therapists inquire about sobriety date and number of community support meetings attended since last session.   The therapists introduce the new group member. Therapists discuss and prompt discussion on the following issues today: Cannabis Hyperemesis, Treatment Centers that do not follow the science of addiction, as they will not admit persons who have the diagnosis of cannabis use disorder, unless they have another drug use disorder in addition to the diagnosis with the idea that cannabis is not a serious addiction that requires residential treatment, how perspectives differ among people, how a person in unable to think oneself out of addition, recovery taking a leap of faith requiring trust in the process of recovery and being open to listening what has helped others even when one totally does not totally intellectually understand how certain processes work, discuss co-dependency and the pitfalls of not putting one's recovery first, rescue fantasies.   Summary: Tracy Lambert presents rating her depression as a 5 and her anxiety as a 3.  Tracy Lambert reports the same sobriety date. She is attending meetings and has a sponsor.  Tracy Lambert identifies her emotions as irritable and frustrated. She says things are going wellish.  Tracy Lambert shares that she is not doing what she should be doing. She elaborates that she is not doing the basics such a exercising, looking for jobs or putting her all into engaging with her sponsor. She says she feels as if she has hit a wall.  She says she has been cleaning her house.  Therapists inquire if she has shared this information with her sponsor and Tracy Lambert says she has told a little bit of this information.  Therapists encourage her to let  her sponsor in on what is going on with her and she what her sponsor says.  Tracy Lambert says she feels like she is in the danger zone and feels guilty for not doing what she needs to do.  Therapists encourage her to focus on one thing today and she chooses exercise as the the activity she will focus on. She says she has a tread mill under her sofa that she is going to pull out and will walk in 20 minute increments for a three cycles so she can get 10,000 steps in.   Tracy Lambert says her take away today is that we really are all the same though we come from different places.        Progress Towards Goals: Tracy Lambert reports no change in her sobriety date.  UDS collected: Yes Results: Negative   AA/NA attended?: Yes   Sponsor?:  Yes  Tracy Simpler, MS, LMFT, LCAS [address], [place], [place], [facility], LCAS  09/20/2024

## 2024-09-22 ENCOUNTER — Other Ambulatory Visit: Payer: Self-pay | Admitting: Physician Assistant

## 2024-09-22 DIAGNOSIS — G43809 Other migraine, not intractable, without status migrainosus: Secondary | ICD-10-CM

## 2024-09-23 ENCOUNTER — Ambulatory Visit (INDEPENDENT_AMBULATORY_CARE_PROVIDER_SITE_OTHER): Payer: Self-pay | Admitting: Licensed Clinical Social Worker

## 2024-09-23 DIAGNOSIS — F4312 Post-traumatic stress disorder, chronic: Secondary | ICD-10-CM

## 2024-09-23 DIAGNOSIS — F1020 Alcohol dependence, uncomplicated: Secondary | ICD-10-CM | POA: Diagnosis not present

## 2024-09-23 DIAGNOSIS — F1994 Other psychoactive substance use, unspecified with psychoactive substance-induced mood disorder: Secondary | ICD-10-CM

## 2024-09-23 DIAGNOSIS — Z79899 Other long term (current) drug therapy: Secondary | ICD-10-CM

## 2024-09-23 NOTE — Progress Notes (Signed)
 Daily Group Progress Note   Program: CD IOP     Group Time: 9 a.m. to 12 p.m.      Type of Therapy: Process and Psychoeducational    Topic: The therapists check in with group members, assess for SI/HI/psychosis and overall level of functioning. The therapists inquire about sobriety date and number of community support meetings attended since last session.   The therapists facilitate group discussion on a number of topics including isolation as something to be avoided by people in recovery as well as bereavement support programs to learn to handle grief in a healthy manner so that it is not a trigger for using. The therapists share what research says about regular Twelve Step and/or mutual sober support programs leading to higher rates of abstinence compared to those who attend occasionally or do not attend at all. The therapists explain that a core principle of the Twelve Step program is that they never give up on people and welcome them back if they relapse as this is a relapsing disease until people eventually identify all of their triggers and learn ways to deal with them without using. The therapists discuss the concept of addicts helping other addicts and how service work helps many to remain sober.     Summary: Tracy Lambert presents today rating her depression and anxiety both as a 3. She describes her mood as frustrated and stir crazy saying that she is in a weird space as she is missing part of her identity not working while at the same time enjoying it.  She almost leaves group when another group member becomes irate but stays after he opts to leave. Malan called her Sponsor after group on Friday and told her all that has been going on with her noting that this helped. Additionally, she got on Linked In and applied for several jobs and got acquainted with it. None-the-less, she goes on to criticize herself for not exercising with the therapists and other group members pointing out that Tracy Lambert  does not give herself enough credit for the positive things that she does.  Tracy Lambert admits that her mom never gave her credit and that she has assumed people would not care as her mom did not care. When she suggests that this pattern cannot change, the therapists inform that this pattern can change and that Tracy Lambert will find that she does matter more than she thinks to her natural supports.   She tears up in talking about her relationship with her mom with another group member opening up about his similar relationship with his father and how it was finally able to improve for the first time in the past several years.   The therapist suggests that when Tracy Lambert finds herself wanting to isolate that she must not give in to this and force herself to call someone and share how she is feeling as she did recently with her Sponsor.    Progress Towards Goals: Scotty reports no change in her sobriety date.  UDS collected: Yes Results: No   AA/NA attended?: Yes   Sponsor?:  Yes   [name], MA, LCSW, [facility], LCAS Darice Simpler, MS, LMFT, LCAS 09/23/2024

## 2024-09-25 ENCOUNTER — Ambulatory Visit (INDEPENDENT_AMBULATORY_CARE_PROVIDER_SITE_OTHER): Payer: Self-pay | Admitting: Medical

## 2024-09-25 ENCOUNTER — Encounter (HOSPITAL_COMMUNITY): Payer: Self-pay | Admitting: Medical

## 2024-09-25 DIAGNOSIS — F4312 Post-traumatic stress disorder, chronic: Secondary | ICD-10-CM

## 2024-09-25 DIAGNOSIS — F411 Generalized anxiety disorder: Secondary | ICD-10-CM

## 2024-09-25 DIAGNOSIS — Z79899 Other long term (current) drug therapy: Secondary | ICD-10-CM

## 2024-09-25 DIAGNOSIS — F1020 Alcohol dependence, uncomplicated: Secondary | ICD-10-CM

## 2024-09-25 DIAGNOSIS — G43809 Other migraine, not intractable, without status migrainosus: Secondary | ICD-10-CM

## 2024-09-25 DIAGNOSIS — F39 Unspecified mood [affective] disorder: Secondary | ICD-10-CM

## 2024-09-25 DIAGNOSIS — F1994 Other psychoactive substance use, unspecified with psychoactive substance-induced mood disorder: Secondary | ICD-10-CM

## 2024-09-25 LAB — TOXICOLOGY SCREEN, URINE: Creatinine, POC: 53 mg/dL

## 2024-09-25 NOTE — Progress Notes (Addendum)
 [facility] Follow-up Outpatient CDIOP Date: 09/25/2024  Admission Date:07/03/2024   Sobriety date:08/12/2024   Subjective: I'm happy here (on leave) not working stresful job and not drinking. I am learning a lot  HPI : CD IOP Provider FU Allmon Is now 83 days into treatment and 44 days sober. She initially struggled with cravings that required MAT with Baclofen  which she requested 07/24/2024.At the same time she became aware of her job as [employer]GER and requested tombe puton full time Family leave to complete herv treatment. She has been able to abstain since 08/12/24.At FU 10/22 she reported she finally saw that her brain reacts to alcohol  with a severe combination of obsessive reaction beyond her mental control which requires her to keep alcohol  out of her body/brain.   She has continued her Wellbutrin  and reports her headaches have diminished (She a hx of Migraine) She says her mood and anxiety are well managed . She also remarks My skin is clear  Counselor's report:  Summary: Cassundra presents today rating her depression and anxiety both as a 2.  She describes her mood as happy and excited.   She shares her story with newer group members talking about how the first 3 to 4 weeks that she was out of work were magical.  Then, she realized that she had all of this time and felt like she should be doing things.  She eventually applied for jobs saying that she did get a callback from 1 of these applications but it is not a job that she really wants.  She is going to go on the job interview, if for nothing else, to practice her interview skills.   She admits that her sponsor has been getting on her nerves.  Her sponsor called Sabryn out about not being consistent with checking in and doing her gratitude list.  Her sponsor scheduled to meet up with Tyonna to do some work after a meeting but the sponsor did not show for the meeting.  She did not send Janequa any sort  of message until days later when she let her know that she did not show because her back was out.   Cortasia now has some feelings of resentment towards her sponsor but has not addressed this saying that she is nonconfrontational.  The therapist gives her the homework of assertively letting her sponsor know that she was upset that her sponsor did not send her a text letting her know that she would not make their meeting as a result of her back being out rather than waiting days to do this especially as these are the types of expectations that she holds in place for [facility] to meet.   Review of Systems: Psychiatric: Agitation: See Counselor report Hallucination: No Depressed Mood: see Counselor reports Insomnia: No Hypersomnia: No Altered Concentration: No Feels Worthless: Not consciously but PTSD memories remain Grandiose Ideas: No Belief In Special Powers: No New/Increased Substance Abuse: No Compulsions: Denies craving  Neurologic: Headache: No Seizure: No Paresthesias: No  Current Medications: Your Medication List  b complex vitamins capsule Take 1 capsule by mouth daily.  baclofen  10 MG tablet Commonly known as: LIORESAL  TAKE 1 TABLET BY MOUTH THREE TIMES A DAY  buPROPion  150 MG 12 hr tablet Commonly known as: Wellbutrin  SR Take 1 tablet (150 mg total) by mouth 2 (two) times daily.  FISH OIL PO Take by mouth.  multivitamin tablet Take 1 tablet by mouth daily.  propranolol  20 MG tablet Commonly  known as: INDERAL  TAKE 1 TABLET BY MOUTH EVERYDAY AT BEDTIME  QUEtiapine  100 MG tablet Commonly known as: SEROQUEL  TAKE 1 TABLET BY MOUTH EVERYDAY AT BEDTIME  SUMAtriptan  50 MG tablet Commonly known as: IMITREX  TAKE ONE TABLET BY MOUTH AT ONSET OF HEADACHE MAY REPEAT ONE TABLET IN 2 HOURS IF NEEDED.  traZODone  50 MG tablet Commonly known as: DESYREL  Take 1 tablet (50 mg total) by mouth at bedtime as needed. for sleep  VITAMIN B-6 PO Take by mouth.  VITAMIN D (CHOLECALCIFEROL) PO Take  by mouth.    Mental Status Examination  Appearance:Well groomed Alert: Yes Attention: good  Cooperative: Yes Eye Contact: Good Speech: Clear and coherent, rate WNL Psychomotor Activity: Normal Memory:Trauma informed Concentration/Attention: Normal/intact Oriented: person, place, time/date and situation Mood;Euthymic/happy Insight:Limited Judgement: Improved  LID:Rozjm 09/16/2024  PDMP: 06/04/2024 03/20/2024  1 Alprazolam  0.5 Mg Tablet 30.00    Diagnosis: Alcohol  use disorder, severe, dependence (HCC) Chronic prescription benzodiazepine use Chronic post-traumatic stress disorder (PTSD) Substance induced mood disorder (HCC) Episodic mood disorder Anxiety state Insomnia, unspecified type Alcohol  use disorder, moderate, in early remission (HCC) Family history of alcoholism in mother Anxious depression Biological mother, perpetrator of maltreatment and neglec  Assessment:Gaining sober time  Treatment Plan:Per admission and Counselors FU 2 wks Carlin Emmer, PA-CPatient ID: [name], female   DOB: [birth year], 48 y.o.   MRN: [id number]

## 2024-09-25 NOTE — Progress Notes (Addendum)
 Daily Group Progress Note   Program: CD IOP     Group Time: 9 a.m. to 12 p.m.      Type of Therapy: Process and Psychoeducational    Topic: The therapists check in with group members, assess for SI/HI/psychosis and overall level of functioning. The therapists inquire about sobriety date and number of community support meetings attended since last session.   The therapists introduce a new group member while announcing the recent passing of another. The therapists discuss the importance of tobacco cessation making group members aware of Quitline . The therapists explain that quitting is not only one of the best things a person can do for his or her health but also increases their ability to achieve long-term sobriety from drugs and alcohol .  The therapists discuss the importance of assertiveness as it relates to recovery and overcoming grief. The therapists continue to emphasize the importance of avoiding people who are in active addiction and challenge group members to consider why others in active addiction take the inventories of other people rather than focusing on themselves. Also, they encourage group members to consider why people in active addiction are not a healthy source of emotional support.     Summary: Tracy Lambert presents today rating her depression and anxiety both as a 2.  She describes her mood as happy and excited.  She shares her story with newer group members talking about how the first 3 to 4 weeks that she was out of work were magical.  Then, she realized that she had all of this time and felt like she should be doing things.  She eventually applied for jobs saying that she did get a callback from 1 of these applications but it is not a job that she really wants.  She is going to go on the job interview, if for nothing else, to practice her interview skills.  She admits that her sponsor has been getting on her nerves.  Her sponsor called Tracy Lambert out about not being  consistent with checking in and doing her gratitude list.  Her sponsor scheduled to meet up with Tracy Lambert to do some work after a meeting but the sponsor did not show for the meeting.  She did not send Tracy Lambert any sort of message until days later when she let her know that she did not show because her back was out.  Tracy Lambert now has some feelings of resentment towards her sponsor but has not addressed this saying that she is nonconfrontational.  The therapist gives her the homework of assertively letting her sponsor know that she was upset that her sponsor did not send her a text letting her know that she would not make their meeting as a result of her back being out rather than waiting days to do this especially as these are the types of expectations that she holds in place for [name] to meet.     Progress Towards Goals: Tracy Lambert reports no change in her sobriety date.  UDS collected: No Results: No   AA/NA attended?: Yes   Sponsor?:  Yes   [name], MA, LCSW, [facility], LCAS Darice Simpler, MS, LMFT, LCAS 09/25/2024

## 2024-09-25 NOTE — Addendum Note (Signed)
 Addended by: LEILA CARLIN BRAVO on: 09/25/2024 03:56 PM   Modules accepted: Level of Service

## 2024-09-26 ENCOUNTER — Other Ambulatory Visit (HOSPITAL_COMMUNITY): Payer: Self-pay | Admitting: Medical

## 2024-09-26 ENCOUNTER — Encounter: Payer: Self-pay | Admitting: Medical

## 2024-09-27 ENCOUNTER — Encounter (HOSPITAL_COMMUNITY): Payer: Self-pay

## 2024-09-27 ENCOUNTER — Ambulatory Visit (HOSPITAL_COMMUNITY): Payer: Self-pay

## 2024-09-27 ENCOUNTER — Telehealth (HOSPITAL_COMMUNITY): Payer: Self-pay

## 2024-09-27 NOTE — Telephone Encounter (Signed)
 Therapist calls Tracy Lambert as she missed today's group. A peer says Maecie texted her last evening saying she did not feel well but did not mention that she would not be in group today. Therapist reaches Voice Mail and left a HIPAA compliant message requesting a return call.  Darice Simpler, MS, LMFT, LCAS 09/27/24

## 2024-09-30 ENCOUNTER — Ambulatory Visit (INDEPENDENT_AMBULATORY_CARE_PROVIDER_SITE_OTHER): Payer: Self-pay | Admitting: Licensed Clinical Social Worker

## 2024-09-30 DIAGNOSIS — F1994 Other psychoactive substance use, unspecified with psychoactive substance-induced mood disorder: Secondary | ICD-10-CM

## 2024-09-30 DIAGNOSIS — F1020 Alcohol dependence, uncomplicated: Secondary | ICD-10-CM

## 2024-09-30 DIAGNOSIS — F4312 Post-traumatic stress disorder, chronic: Secondary | ICD-10-CM

## 2024-09-30 DIAGNOSIS — Z79899 Other long term (current) drug therapy: Secondary | ICD-10-CM

## 2024-09-30 NOTE — Progress Notes (Signed)
 Daily Group Progress Note   Program: CD IOP     Group Time: 9 a.m. to 12 p.m.      Type of Therapy: Process and Psychoeducational    Topic: The therapists check in with group members, assess for SI/HI/psychosis and overall level of functioning. The therapists inquire about sobriety date and number of community support meetings attended since last session.   The therapist address the issue of co-dependence as it relates to recovery explaining that persons with co-dependency issues have a pathological need to be needed which is the reason that they get into caretaker relationships with persons with dependency issues.   The therapist facilitates a discussion on trauma as it relates to addiction and as it relates to issues with chronic anxiety. The therapist explains that breathing exercises and mindfulness practices have limits on their effectiveness in relation to anxiety related to severe trauma as trauma can cause permanent changes to one's brain and nervous system. Thus, persons with PTSD may need psychotropic medication in addition to these other tools.  The therapist normalizes grieving over one's addiction pointing out that it served a function; however, one must realize that he or she cannot have the positives of using without the negatives as they are intertwined. The therapist suggests that many treatment facilities have patients write letters to their disease to work through these feelings.  The therapists explain the differences between passive, assertive, and aggressive responses and have group members complete an exercise rating how assertive they can be in different social situations.     Summary: Trivia presents today rating both her depression and anxiety as a 2.  She describes her mood as calm and grounded.  She says that her sobriety date remains September 30 and that she is now going on 47 to 48 days sober.  She has continued to attend meetings but says that her sponsor is  still annoying.  She says that she sent her sponsor a text message asking if she was going to be at the meeting on Saturday and did not hear a response until 4 hours later.  Cortez has not spoken to her sponsor as of yet about being upset that her sponsor did not send her a text message to let her know that he would not make their scheduled meeting after her sponsors back went out.  She says that one of the problems may be that she and her sponsor are too much alike.  Another group member who has met [employer] sponsor says that he agrees with this assessment and thus would not be surprised if they were to bump heads from time to time.  She also adds that now that she has done her fifth step with her sponsor that her sponsor knows too much about Camdynn which makes her feel too vulnerable.  The therapist encourages her to assert herself with her sponsor and not flee this relationship due to a fear of vulnerability.  As of late, she admits that she feels like she is waiting for something bad to happen and also talks about her feelings of grief in relation to letting go of drinking even though she knows that it has caused a great deal of destruction in her life.  In discussing assertiveness, Kasha admits that she has difficulty asking for help but does say that she has improved and that she will be less inclined to blow up and express her anger assertively as she has learned the value of sitting on things for  a while rather than reacting immediately.   Progress Towards Goals: Benedict reports no change in her sobriety date.  UDS collected: Yes results: Yes, negative for drugs and alcohol    AA/NA attended?: Yes   Sponsor?:  Yes   [name], MA, LCSW, [facility], LCAS Darice Simpler, MS, LMFT, LCAS 09/30/2024

## 2024-10-02 ENCOUNTER — Ambulatory Visit (INDEPENDENT_AMBULATORY_CARE_PROVIDER_SITE_OTHER): Payer: Self-pay | Admitting: Licensed Clinical Social Worker

## 2024-10-02 DIAGNOSIS — F1020 Alcohol dependence, uncomplicated: Secondary | ICD-10-CM

## 2024-10-02 DIAGNOSIS — Z79899 Other long term (current) drug therapy: Secondary | ICD-10-CM

## 2024-10-02 DIAGNOSIS — F4312 Post-traumatic stress disorder, chronic: Secondary | ICD-10-CM

## 2024-10-02 DIAGNOSIS — F1994 Other psychoactive substance use, unspecified with psychoactive substance-induced mood disorder: Secondary | ICD-10-CM

## 2024-10-02 LAB — TOXICOLOGY SCREEN, URINE: Creatinine, POC: 42 mg/dL

## 2024-10-02 NOTE — Progress Notes (Signed)
 Daily Group Progress Note   Program: CD IOP     Group Time: 9 a.m. to 12 p.m.      Type of Therapy: Process and Psychoeducational    Topic: The therapists check in with group members, assess for SI/HI/psychosis and overall level of functioning. The therapists inquire about sobriety date and number of community support meetings attended since last session.  The therapist facilitate discussions on several different topics including the need to be selfish in one's recovery putting recovery above everything else, the need to stop interacting up interacting with people and active addiction in their personal lives, and how eventually coming out as an addict or alcoholic can be liberating and help to shutdown the efforts of those trying to pull them back into using. The therapists continue to stress the importance of assertive communication.     Summary: Adilynn presents today rating her depression as a 4 and her anxiety as a 3.   She is very active in providing feedback and encouragement to another group member who is hanging out with people in active addiction She talks about the fact that she no longer holds any reservations in her sobriety as she has done in the past.  Tamiko has still not spoken to her Sponsor about her resentment towards her. She admits that she can assert herself at work; however, she struggles when it comes up opening up about her feelings. In the past, she was not able to open up with her friends and share her feelings but now can do so. Thus, the therapist voices his confidence that she can do this with her Sponsor and that this is a next step in her growth and progression.  She says that she finds it irritating that she feels like her Sponsor related to her like her underling but then admits that this could be her own projection. Trina does not have time to complete the full check-in today but leaves saying that her mood is irritated and that she will likely go take a  walk.  It is possible that Altheria is irritated as a new group member provides her with some unsolicited feedback about her not asserting herself with her Sponsor framing it in a negative light with Kade briefly indicating her disagreement with the new group member's assessment.   Progress Towards Goals: Fabienne reports no change in her sobriety date.  UDS collected: No Results: No   AA/NA attended?: Yes   Sponsor?:  Yes   [name], MA, LCSW, [facility], LCAS Darice Simpler, MS, LMFT, LCAS 10/02/2024

## 2024-10-04 ENCOUNTER — Ambulatory Visit (HOSPITAL_COMMUNITY): Payer: Self-pay

## 2024-10-04 DIAGNOSIS — F1994 Other psychoactive substance use, unspecified with psychoactive substance-induced mood disorder: Secondary | ICD-10-CM

## 2024-10-04 DIAGNOSIS — F4312 Post-traumatic stress disorder, chronic: Secondary | ICD-10-CM

## 2024-10-04 DIAGNOSIS — F1020 Alcohol dependence, uncomplicated: Secondary | ICD-10-CM

## 2024-10-04 DIAGNOSIS — Z79899 Other long term (current) drug therapy: Secondary | ICD-10-CM

## 2024-10-04 NOTE — Progress Notes (Signed)
 Daily Group Progress Note   Program: CD IOP     Group Time: 9 a.m. to 12 p.m.      Type of Therapy: Process and Psychoeducational    Topic: The therapists check in with group members, assess for SI/HI/psychosis and overall level of functioning. The therapists inquire about sobriety date and number of community support meetings attended since last session.  The therapists show the video of Juliene Linnette Sarah, Why you're outgrowing your friends. Adam discusses how outgrowing friendships in recovery is not a betrayal, rather it is a breakthrough.  He explains as you grow, your associate and friends may not grow with you. He points out how to recognize misaligned relationships and how they can interfere with protecting your own peace and making room for new connections.  Adam asks 4 questions on the Friendship Inventory: 1) who builds you up, 2) who drains you, 3) who do you become when you are around these people, 4) what are you contributing. He also asks people in recovery to name the 5 people closest to you which sets your trajectory.   Summary: Tracy Lambert presents today rating her depression as a 3 and her anxiety as a 3. She reports the same sobriety date. Tracy Lambert says she is attending from 3-4 meetings per week. She has a sponsor and did speak to her but did not directly express her feelings about the sponsor not showing for their last meeting.  Tracy Lambert says she thinks she implied it so her sponsor should know how she feels. Tracy Lambert discusses how she thinks she will return to work because management has changed. Tracy Lambert says she is going to meet with her new manager and have lunch in a couple of weeks.  Tracy Lambert discusses the reason she thinks she felt irritable last session because she has not been engaging in as good as self care as she has been. Tracy Lambert discusses having to cut her mother out of her life at one point as she was so verbally abusive. Tracy Lambert says she has recently started allowing her mother back  in a little. She says her mother called her son, Tracy Lambert brother asking if Tracy Lambert could reach out as she wanted to speak to her in case mother was to die while in surgery for double knee replacement.  Tracy Lambert share she will shares her answers to the questions Juliene Linnette Sarah uses when discussing the Friend Inventory on next Monday.   Progress Towards Goals: Tracy Lambert reports no change in her sobriety date.  UDS collected: No Results: Negative   AA/NA attended?: Yes   Sponsor?:  Yes  Tracy Simpler, Tracy Lambert, Tracy Lambert, Tracy Lambert [address] [place], [place], [facility], Tracy Lambert  10/04/2024

## 2024-10-07 ENCOUNTER — Telehealth (HOSPITAL_COMMUNITY): Payer: Self-pay | Admitting: Licensed Clinical Social Worker

## 2024-10-07 NOTE — Telephone Encounter (Signed)
 The therapist receives the following email from Zyana:  Hey bill! I can't find the number so im sending you this email. Staying home with Beverley today. He's got a pretty bad ankle sprain...giant swollen egg on his ankle and pretty immobile still. He's in a boot with crutches. I should be able to make Wednesday as my brother will be in. Is there IOP on Friday? Thanks!   Kerri Zell Maier, MA, LCSW, [facility], LCAS 10/07/2024

## 2024-10-09 ENCOUNTER — Telehealth (HOSPITAL_COMMUNITY): Payer: Self-pay | Admitting: Licensed Clinical Social Worker

## 2024-10-09 NOTE — Telephone Encounter (Signed)
 The therapist receives the following email from [place]:  Good morning. I was up late getting ready for Thanksgiving. Have a few people coming for dinner tomorrow. Completely over slept this morning. I hope you have a great Thanksgiving and I'll see you Monday.   Kerri Zell Maier, MA, LCSW, [facility], LCAS 10/09/2024

## 2024-10-13 ENCOUNTER — Other Ambulatory Visit: Payer: Self-pay | Admitting: Physician Assistant

## 2024-10-13 DIAGNOSIS — F39 Unspecified mood [affective] disorder: Secondary | ICD-10-CM

## 2024-10-13 DIAGNOSIS — F411 Generalized anxiety disorder: Secondary | ICD-10-CM

## 2024-10-14 ENCOUNTER — Telehealth (HOSPITAL_COMMUNITY): Payer: Self-pay | Admitting: Licensed Clinical Social Worker

## 2024-10-14 ENCOUNTER — Encounter (HOSPITAL_COMMUNITY): Payer: Self-pay | Admitting: Licensed Clinical Social Worker

## 2024-10-14 NOTE — Telephone Encounter (Signed)
 The therapist receives the following email from [place]:  Walterine Peter! It's Mirha. I hope you had a relaxing Thanksgiving and were able to get outside in the sunshine a bit too. Mine was nice and low key. Aarons ankle is so much better. I hit 60 days yesterday. Feeling pretty good.   On a whole other note, I am going back to work tomorrow. I feel ready and have some good tools and practices in place. Had a conference call with my boss and manager Friday and my FMLA  runs out this week so I made the decision to start on Monday. The running out of FMLA caught me a little off guard as I was miscalculating the end date. But I'm ready to get back to work for sure.They are very happy to have me back. It was a really great call. It felt good. Here's to a fresh start!   My days off are Wednesdays. I would love to still attend a few more meetings that day if that's possible. At least come back this week to say goodbye. Let me know what you think.   You can call me on my cell anytime. [phone].   Sincerely,  [address] [place], [place], LCSW, [facility], LCAS 10/14/2024

## 2024-10-14 NOTE — Telephone Encounter (Signed)
 The therapist receives the following email from Karisa:  Tracy Lambert -    First day back is going well. I do need a release form saying that I can come back to work ASAP otherwise they will make me leave. You can email it back to me. Please advise and call with any questions at [phone]. Thanks in advance.    Allen   She then leaves a voicemail saying that she needs a return to work form as soon as possible.   The therapist calls Katlynne confirming her identity and informing her that the requested letter was sent via MyChart. She plans on coming to [facility] on Wednesday and would like to continue a couple of Wednesdays as she gets settled back in at work.   Lambert Maier, MA, LCSW, [facility], LCAS 10/14/2024

## 2024-10-16 ENCOUNTER — Ambulatory Visit (INDEPENDENT_AMBULATORY_CARE_PROVIDER_SITE_OTHER): Payer: Self-pay

## 2024-10-16 DIAGNOSIS — F1994 Other psychoactive substance use, unspecified with psychoactive substance-induced mood disorder: Secondary | ICD-10-CM

## 2024-10-16 DIAGNOSIS — F4312 Post-traumatic stress disorder, chronic: Secondary | ICD-10-CM

## 2024-10-16 DIAGNOSIS — F1020 Alcohol dependence, uncomplicated: Secondary | ICD-10-CM | POA: Diagnosis not present
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
## 2024-10-18 ENCOUNTER — Other Ambulatory Visit: Payer: Self-pay | Admitting: Physician Assistant

## 2024-10-18 DIAGNOSIS — G4700 Insomnia, unspecified: Secondary | ICD-10-CM

## 2024-10-23 ENCOUNTER — Telehealth (HOSPITAL_COMMUNITY): Payer: Self-pay | Admitting: Licensed Clinical Social Worker

## 2024-10-23 NOTE — Telephone Encounter (Signed)
 The therapist receives the following email from [employer]:  Hey bill! I hope you are well. I have to work today unexpectedly and won't be able to make it today. It's going to hard to make Wednesdays now that I am working full time. : (   Things are good though and I hope you are well too.   Kerri Zell Maier, MA, LCSW, [facility], LCAS 10/23/2024

## 2024-11-06 ENCOUNTER — Other Ambulatory Visit: Payer: Self-pay | Admitting: Physician Assistant

## 2024-11-06 DIAGNOSIS — G43809 Other migraine, not intractable, without status migrainosus: Secondary | ICD-10-CM

## 2024-11-21 ENCOUNTER — Ambulatory Visit: Payer: Self-pay | Admitting: Family Medicine

## 2024-11-27 ENCOUNTER — Ambulatory Visit (INDEPENDENT_AMBULATORY_CARE_PROVIDER_SITE_OTHER): Admitting: Physician Assistant

## 2024-11-29 ENCOUNTER — Other Ambulatory Visit: Payer: Self-pay | Admitting: Physician Assistant

## 2024-11-29 DIAGNOSIS — F411 Generalized anxiety disorder: Secondary | ICD-10-CM

## 2024-11-29 NOTE — Telephone Encounter (Signed)
 It looks like she is currently under the care of Carlin Emmer, PA-C, IOP for Alcohol  Use d/o.  I don't feel comfortable prescribing this so I'll defer to him.

## 2024-12-25 ENCOUNTER — Ambulatory Visit: Payer: Self-pay | Admitting: Family Medicine

## 2024-12-25 ENCOUNTER — Ambulatory Visit: Admitting: Physician Assistant
